# Patient Record
Sex: Male | Born: 1960 | Race: White | Hispanic: Yes | Marital: Single | Smoking: Never smoker
Health system: Southern US, Community
[De-identification: ages and names within clinical notes are randomized; demographics above are authoritative.]

## PROBLEM LIST (undated history)

## (undated) DIAGNOSIS — K3189 Other diseases of stomach and duodenum: Secondary | ICD-10-CM

## (undated) DIAGNOSIS — A048 Other specified bacterial intestinal infections: Secondary | ICD-10-CM

## (undated) DIAGNOSIS — K7682 Hepatic encephalopathy: Secondary | ICD-10-CM

## (undated) DIAGNOSIS — K429 Umbilical hernia without obstruction or gangrene: Secondary | ICD-10-CM

## (undated) DIAGNOSIS — K4090 Unilateral inguinal hernia, without obstruction or gangrene, not specified as recurrent: Secondary | ICD-10-CM

## (undated) DIAGNOSIS — D126 Benign neoplasm of colon, unspecified: Secondary | ICD-10-CM

## (undated) DIAGNOSIS — K7460 Unspecified cirrhosis of liver: Secondary | ICD-10-CM

## (undated) DIAGNOSIS — T7840XA Allergy, unspecified, initial encounter: Secondary | ICD-10-CM

## (undated) DIAGNOSIS — I10 Essential (primary) hypertension: Secondary | ICD-10-CM

## (undated) DIAGNOSIS — K7290 Hepatic failure, unspecified without coma: Secondary | ICD-10-CM

## (undated) DIAGNOSIS — K5790 Diverticulosis of intestine, part unspecified, without perforation or abscess without bleeding: Secondary | ICD-10-CM

## (undated) DIAGNOSIS — K766 Portal hypertension: Secondary | ICD-10-CM

## (undated) HISTORY — DX: Hepatic failure, unspecified without coma: K72.90

## (undated) HISTORY — DX: Diverticulosis of intestine, part unspecified, without perforation or abscess without bleeding: K57.90

## (undated) HISTORY — DX: Hepatic encephalopathy: K76.82

## (undated) HISTORY — DX: Unilateral inguinal hernia, without obstruction or gangrene, not specified as recurrent: K40.90

## (undated) HISTORY — DX: Portal hypertension: K76.6

## (undated) HISTORY — DX: Umbilical hernia without obstruction or gangrene: K42.9

## (undated) HISTORY — DX: Allergy, unspecified, initial encounter: T78.40XA

## (undated) HISTORY — DX: Other specified bacterial intestinal infections: A04.8

## (undated) HISTORY — PX: POLYPECTOMY: SHX149

## (undated) HISTORY — PX: COLONOSCOPY: SHX174

## (undated) HISTORY — DX: Benign neoplasm of colon, unspecified: D12.6

## (undated) HISTORY — DX: Other diseases of stomach and duodenum: K31.89

## (undated) HISTORY — DX: Unspecified cirrhosis of liver: K74.60

## (undated) HISTORY — DX: Essential (primary) hypertension: I10

---
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
## 1993-11-20 HISTORY — PX: OTHER SURGICAL HISTORY: SHX169

## 2001-05-09 ENCOUNTER — Encounter: Payer: Self-pay | Admitting: *Deleted

## 2001-05-09 ENCOUNTER — Ambulatory Visit (HOSPITAL_COMMUNITY): Admission: RE | Admit: 2001-05-09 | Discharge: 2001-05-09 | Payer: Self-pay | Admitting: *Deleted

## 2002-12-16 ENCOUNTER — Encounter: Payer: Self-pay | Admitting: Family Medicine

## 2002-12-16 ENCOUNTER — Encounter: Admission: RE | Admit: 2002-12-16 | Discharge: 2002-12-16 | Payer: Self-pay | Admitting: Family Medicine

## 2003-11-21 HISTORY — PX: HERNIA REPAIR: SHX51

## 2004-03-01 ENCOUNTER — Ambulatory Visit (HOSPITAL_BASED_OUTPATIENT_CLINIC_OR_DEPARTMENT_OTHER): Admission: RE | Admit: 2004-03-01 | Discharge: 2004-03-01 | Payer: Self-pay | Admitting: General Surgery

## 2007-01-07 ENCOUNTER — Ambulatory Visit: Payer: Self-pay | Admitting: Family Medicine

## 2007-01-28 ENCOUNTER — Ambulatory Visit: Payer: Self-pay | Admitting: Family Medicine

## 2007-04-01 ENCOUNTER — Ambulatory Visit: Payer: Self-pay | Admitting: Family Medicine

## 2007-04-03 ENCOUNTER — Ambulatory Visit (HOSPITAL_COMMUNITY): Admission: RE | Admit: 2007-04-03 | Discharge: 2007-04-03 | Payer: Self-pay | Admitting: Family Medicine

## 2008-05-20 ENCOUNTER — Ambulatory Visit: Payer: Self-pay | Admitting: Family Medicine

## 2009-06-08 ENCOUNTER — Ambulatory Visit: Payer: Self-pay | Admitting: Family Medicine

## 2009-08-05 ENCOUNTER — Ambulatory Visit: Admission: RE | Admit: 2009-08-05 | Discharge: 2009-08-05 | Payer: Self-pay | Admitting: Family Medicine

## 2009-08-05 ENCOUNTER — Ambulatory Visit: Payer: Self-pay | Admitting: Vascular Surgery

## 2009-08-05 ENCOUNTER — Encounter: Payer: Self-pay | Admitting: Family Medicine

## 2010-10-06 ENCOUNTER — Ambulatory Visit: Payer: Self-pay | Admitting: Family Medicine

## 2011-04-07 NOTE — Op Note (Signed)
NAME:  Christopher Craig, Christopher Craig NO.:  [id number]   MEDICAL RECORD NO.:  [id number]                   PATIENT TYPE:  AMB   LOCATION:  DSC                                  FACILITY:  [facility]   PHYSICIAN:  Adolph Pollack, M.D.            DATE OF BIRTH:  [birth year]   DATE OF PROCEDURE:  03/01/2004  DATE OF DISCHARGE:                                 OPERATIVE REPORT   PREOPERATIVE DIAGNOSIS:  Left inguinal hernia.   POSTOPERATIVE DIAGNOSIS:  Indirect left inguinal hernia.   OPERATION PERFORMED:  Left inguinal hernia repair with mesh.   SURGEON:  Adolph Pollack, M.D.   ANESTHESIA:  General laryngeal mask airway   INDICATIONS FOR PROCEDURE:  Christopher Craig is a 50 year old male very active who  has noted some pain and a bulge in the left groin area.  He has an obvious  left inguinal hernia on exam.  He presented to the outpatient surgery center  for left inguinal hernia repair.  Of note was he had some hypertension  preoperatively immediately in the outpatient center and has a history of it  in the past but is no longer on medication.  His diastolic blood pressure is  up to 112.  With a little sedation it came down so we decided to proceed.   DESCRIPTION OF PROCEDURE:  He was placed supine on the operating table,  initially given some intravenous sedation.  The left groin was clipped and  then sterilely prepped and draped.  Local anesthetic was infiltrated in the  left groin both superficially and deep and then an incision was made through  the skin and subcutaneous tissue and Scarpa's fascia.  However, he  was  fairly tense and in order for me to have adequate exposure, I needed more  relaxation.  Thus a general anesthetic was given by way of LMA.  This  allowed for adequate relaxation.  I infiltrated more local anesthetic  (consisting of 1% lidocaine with 0.5% Marcaine) deep to the external oblique  aponeurosis and then made an incision in the external  oblique aponeurosis  through the external ring medially and up toward the anterior superior iliac  spine laterally.  The ilioinguinal nerve was identified and the perineural  injection of local anesthetic given.  I then isolated the spermatic cord and  partially reduced an indirect hernia.  I was then able to dissect the cord  and the sac and dissect the sac free from the cord and reduce it back into  the extraperitoneal space through a patchless internal ring.   Next, the cord was retracted superiorly and I anchored a piece of 3 x 6 inch  mesh 1 cm medial to the pubic tubercle.  The inferior aspect of the mesh was  anchored to the shelving edge of the inguinal ligament with a running 2-0  Prolene suture to an area 1cm lateral to the internal ring.  A slit was cut  in the mesh and was wrapped around the cord. The superior aspect of the mesh  was anchored to the internal oblique muscle and aponeurosis with interrupted  2-0 Vicryl sutures.  Following this, the two tails of the mesh were crossed  creating a new internal ring and these two tails were anchored to the  shelving edge of the inguinal ligament with single 2-0 Prolene suture.  The  tip of the hemostat could be placed through the new aperture.   I inspected the wound and hemostasis was adequate.  The lateral aspects of  the mesh were tucked deep to the external oblique aponeurosis which was  closed over the mesh and cord with a running 3-0 Vicryl suture.  Scarpa's  fascia was closed with a running 2-0 Vicryl suture.  The skin was closed  with a 4-0 Monocryl subcuticular stitch.  Steri-Strips and sterile dressings  were applied.  The patient tolerated the procedure well without any apparent  complications.  The left testicle was in its normal position in the scrotum.  He subsequently was taken to the recovery room in satisfactory condition.  Discharge instructions and Tylox for pain were given to him.  I recommend he  see his primary  care physician later this week to have his blood pressure  rechecked and I discussed this with his family.                                               Adolph Pollack, M.D.    Kari Baars  D:  03/01/2004  T:  03/02/2004  Job:  [id number]   cc:   Sharlot Gowda, M.D.  [address]  [place], [place] [address]  Fax: [phone]

## 2011-08-24 ENCOUNTER — Encounter: Payer: Self-pay | Admitting: Family Medicine

## 2012-02-02 ENCOUNTER — Other Ambulatory Visit: Payer: Self-pay | Admitting: Family Medicine

## 2012-02-05 ENCOUNTER — Other Ambulatory Visit: Payer: Self-pay | Admitting: Internal Medicine

## 2012-02-06 ENCOUNTER — Ambulatory Visit (INDEPENDENT_AMBULATORY_CARE_PROVIDER_SITE_OTHER): Payer: 59 | Admitting: Family Medicine

## 2012-02-06 ENCOUNTER — Encounter: Payer: Self-pay | Admitting: Family Medicine

## 2012-02-06 ENCOUNTER — Telehealth: Payer: Self-pay | Admitting: Family Medicine

## 2012-02-06 VITALS — BP 150/90 | HR 84 | Temp 100.2°F | Ht 68.0 in | Wt 226.0 lb

## 2012-02-06 DIAGNOSIS — Z0000 Encounter for general adult medical examination without abnormal findings: Secondary | ICD-10-CM

## 2012-02-06 DIAGNOSIS — I10 Essential (primary) hypertension: Secondary | ICD-10-CM

## 2012-02-06 DIAGNOSIS — E669 Obesity, unspecified: Secondary | ICD-10-CM

## 2012-02-06 DIAGNOSIS — J069 Acute upper respiratory infection, unspecified: Secondary | ICD-10-CM

## 2012-02-06 LAB — POCT URINALYSIS DIPSTICK
Bilirubin, UA: NEGATIVE
Ketones, UA: NEGATIVE
Leukocytes, UA: NEGATIVE
Protein, UA: NEGATIVE
Spec Grav, UA: 1.01

## 2012-02-06 MED ORDER — ATENOLOL-CHLORTHALIDONE 100-25 MG PO TABS: 1.0000 | ORAL_TABLET | Freq: Every day | ORAL | Status: DC

## 2012-02-06 MED ORDER — LISINOPRIL 10 MG PO TABS: 10.0000 mg | ORAL_TABLET | Freq: Every day | ORAL | Status: DC

## 2012-02-06 NOTE — Progress Notes (Signed)
  Subjective:    Patient ID: Christopher Craig, male    DOB: 01/13/1961, 51 y.o.   MRN: [id number]  HPI He is here for complete examination. He does have a 2 day history of nasal congestion, rhinorrhea, cough but no fever, chills, sore throat or earache. He continues on his blood pressure medication. He works 12 hours per day which interferes with his physical activities. His weight has been slowly going up. He is presently not dating anyone and not involved in an exercise program. He will schedule his own colonoscopy.   Review of Systems  Constitutional: Negative.   HENT: Negative.   Eyes: Negative.   Respiratory: Negative.   Cardiovascular: Negative.   Gastrointestinal: Negative.   Genitourinary: Negative.   Musculoskeletal: Negative.   Skin: Negative.   Neurological: Negative.   Hematological: Negative.   Psychiatric/Behavioral: Negative.        Objective:   Physical Exam BP 150/90  Pulse 84  Temp(Src) 100.2 F (37.9 C) (Oral)  Ht 5\' 8"  (1.727 m)  Wt 226 lb (102.513 kg)  BMI 34.36 kg/m2  General Appearance:    Alert, cooperative, no distress, appears stated age  Head:    Normocephalic, without obvious abnormality, atraumatic  Eyes:    PERRL, conjunctiva/corneas clear, EOM's intact, fundi    benign  Ears:    Normal TM's and external ear canals  Nose:   Nares normal, mucosa normal, no drainage or sinus   tenderness  Throat:   Lips, mucosa, and tongue normal; teeth and gums normal  Neck:   Supple, no lymphadenopathy;  thyroid:  no   enlargement/tenderness/nodules; no carotid   bruit or JVD  Back:    Spine nontender, no curvature, ROM normal, no CVA     tenderness  Lungs:     Clear to auscultation bilaterally without wheezes, rales or     ronchi; respirations unlabored  Chest Wall:    No tenderness or deformity   Heart:    Regular rate and rhythm, S1 and S2 normal, no murmur, rub   or gallop  Breast Exam:    No chest wall tenderness, masses or gynecomastia  Abdomen:      Soft, non-tender, nondistended, normoactive bowel sounds,    no masses, no hepatosplenomegaly  Genitalia:   deferred   Rectal:   deferred   Extremities:   No clubbing, cyanosis or edema  Pulses:   2+ and symmetric all extremities  Skin:   Skin color, texture, turgor normal, no rashes or lesions  Lymph nodes:   Cervical, supraclavicular, and axillary nodes normal  Neurologic:   CNII-XII intact, normal strength, sensation and gait; reflexes 2+ and symmetric throughout          Psych:   Normal mood, affect, hygiene and grooming.           Assessment & Plan:   1. Routine general medical examination at a health care facility  CBC with Differential, Comprehensive metabolic panel, Lipid panel  2. Hypertension  lisinopril (PRINIVIL,ZESTRIL) 10 MG tablet  3. Obesity (BMI 30-39.9)  CBC with Differential, Comprehensive metabolic panel, Lipid panel  4. URI, acute     symptomatic care for the URI. I will add lisinopril to his regimen and continue his other medicines. He is to call me in one month and let me know his blood pressure is doing.

## 2012-02-06 NOTE — Patient Instructions (Signed)
Call me in one month with your blood pressure reading.

## 2012-02-06 NOTE — Telephone Encounter (Signed)
Tenoretic called in.

## 2012-02-07 ENCOUNTER — Telehealth: Payer: Self-pay

## 2012-02-07 LAB — LIPID PANEL
Cholesterol: 125 mg/dL (ref 0–200)
HDL: 32 mg/dL — ABNORMAL LOW (ref >39)
Total CHOL/HDL Ratio: 3.9 Ratio

## 2012-02-07 LAB — COMPREHENSIVE METABOLIC PANEL
ALT: 136 U/L — ABNORMAL HIGH (ref 0–53)
Albumin: 4.7 g/dL (ref 3.5–5.2)
BUN: 14 mg/dL (ref 6–23)
CO2: 26 mEq/L (ref 19–32)
Calcium: 9.5 mg/dL (ref 8.4–10.5)
Chloride: 95 mEq/L — ABNORMAL LOW (ref 96–112)
Creat: 0.91 mg/dL (ref 0.50–1.35)
Potassium: 3.4 mEq/L — ABNORMAL LOW (ref 3.5–5.3)

## 2012-02-07 LAB — CBC WITH DIFFERENTIAL/PLATELET
Eosinophils Relative: 2 % (ref 0–5)
HCT: 47.5 % (ref 39.0–52.0)
Hemoglobin: 17.1 g/dL — ABNORMAL HIGH (ref 13.0–17.0)
Lymphocytes Relative: 29 % (ref 12–46)
Lymphs Abs: 2.6 10*3/uL (ref 0.7–4.0)
MCV: 95.2 fL (ref 78.0–100.0)
Monocytes Absolute: 1 10*3/uL (ref 0.1–1.0)
Neutro Abs: 5.3 10*3/uL (ref 1.7–7.7)
RBC: 4.99 MIL/uL (ref 4.22–5.81)
WBC: 9.2 10*3/uL (ref 4.0–10.5)

## 2012-02-07 NOTE — Telephone Encounter (Signed)
Pt has been trying home remedies for his URI but he would like a antibiotic please

## 2012-02-07 NOTE — Telephone Encounter (Signed)
Let him know that I did not think an antibiotic would do him any good. Continue to treat with conservative care if his symptoms last over a week, have him call me

## 2012-02-07 NOTE — Telephone Encounter (Signed)
Pt informed and agreed

## 2012-03-07 ENCOUNTER — Telehealth: Payer: Self-pay | Admitting: Family Medicine

## 2012-03-12 NOTE — Telephone Encounter (Signed)
LM

## 2012-10-04 ENCOUNTER — Encounter: Payer: Self-pay | Admitting: Medical

## 2012-10-04 ENCOUNTER — Ambulatory Visit (INDEPENDENT_AMBULATORY_CARE_PROVIDER_SITE_OTHER): Payer: 59 | Admitting: Medical

## 2012-10-04 VITALS — BP 122/82 | HR 62 | Temp 98.2°F | Resp 16 | Wt 199.0 lb

## 2012-10-04 DIAGNOSIS — H60549 Acute eczematoid otitis externa, unspecified ear: Secondary | ICD-10-CM

## 2012-10-04 DIAGNOSIS — H60509 Unspecified acute noninfective otitis externa, unspecified ear: Secondary | ICD-10-CM

## 2012-10-04 NOTE — Patient Instructions (Signed)
Your symptoms and exam suggest eczema or atopic dermatitis of ear and canal.   I recommend for the next week use OTC Hydrocortisone cream such as Cortaid 1%.  apply daily to twice daily with qtip or finger for the next several days.  Once this calms down, use a daily moisturizing lotion or Vaseline topically daily.    Keep the area clean with mild soap and water.  You can also try a zinc base soap or shampoo OTC or selsun blue shampoo.    Recheck in 2-3 weeks.

## 2012-10-04 NOTE — Progress Notes (Signed)
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  Normally sees Dr. Susann Givens here. He is here for ear c/o.  Has had this intermittent for the last year at least.  Feels discomfort and pressure in right ear in canal.  Not painful but is aggravating, itchy, raw at times, red, flaky.  Has to clean it every morning from both ears which is not normal. People at work notice and he is self conscious of this.  He sometimes uses rubbing alcohol to clean it.  No other aggravating or relieving factors.  No hearing loss, no pain, no pus drainage.    Objective: Gen: wd, wn, nad bilat external ear along the inner most pinna and somewhat into the canals bilat with erythema, flaking, raw irritated skin.  No canal swelling, no purulent discharge, TMs pearly.  Assessment: Encounter Diagnosis  Name Primary?  . Eczema of external ear Yes   Plan: Patient Instructions  Your symptoms and exam suggest eczema or atopic dermatitis of ear and canal.   I recommend for the next week use OTC Hydrocortisone cream such as Cortaid 1%.  apply daily to twice daily with qtip or finger for the next several days.  Once this calms down, use a daily moisturizing lotion or Vaseline topically daily.    Keep the area clean with mild soap and water.  You can also try a zinc base soap or shampoo OTC or selsun blue shampoo.    Recheck in 2-3 weeks.

## 2012-10-31 ENCOUNTER — Ambulatory Visit (INDEPENDENT_AMBULATORY_CARE_PROVIDER_SITE_OTHER): Payer: 59 | Admitting: Family Medicine

## 2012-10-31 ENCOUNTER — Encounter: Payer: Self-pay | Admitting: Family Medicine

## 2012-10-31 VITALS — BP 126/88 | HR 80 | Wt 194.0 lb

## 2012-10-31 DIAGNOSIS — L259 Unspecified contact dermatitis, unspecified cause: Secondary | ICD-10-CM

## 2012-10-31 DIAGNOSIS — L309 Dermatitis, unspecified: Secondary | ICD-10-CM

## 2012-10-31 NOTE — Progress Notes (Signed)
  Subjective:    Patient ID: Christopher Craig, male    DOB: 10/18/1961, 51 y.o.   MRN: [id number]  HPI He is here for recheck. Still does have difficulty with scaling and itching in both ears. He has been using cortisone sparingly for a short period of time. Also of note is the fact that he has lost a considerable amount of weight making dietary and exercise changes.   Review of Systems     Objective:   Physical Exam Alert and in no distress. Exam of both ear canals show erythema slight drying and scaling.       Assessment & Plan:   1. Eczema    recommend he use the cortisone cream twice per day for the next 2 or 3 weeks minimum and after that he can spread the therapy out but still these to continue

## 2012-10-31 NOTE — Patient Instructions (Signed)
Use cortisone cream twice a day for the next several weeks. Once it is under control thenyou can back off

## 2013-01-04 ENCOUNTER — Other Ambulatory Visit: Payer: Self-pay

## 2013-02-02 ENCOUNTER — Encounter: Payer: Self-pay | Admitting: Family Medicine

## 2013-02-03 ENCOUNTER — Other Ambulatory Visit: Payer: Self-pay

## 2013-02-03 DIAGNOSIS — I10 Essential (primary) hypertension: Secondary | ICD-10-CM

## 2013-02-03 MED ORDER — LISINOPRIL 10 MG PO TABS: 10.0000 mg | ORAL_TABLET | Freq: Every day | ORAL | Status: DC

## 2013-02-03 MED ORDER — ATENOLOL-CHLORTHALIDONE 100-25 MG PO TABS: 1.0000 | ORAL_TABLET | Freq: Every day | ORAL | Status: DC

## 2013-02-03 NOTE — Telephone Encounter (Signed)
Sent in pt b/p meds

## 2013-03-07 ENCOUNTER — Encounter: Payer: Self-pay | Admitting: Family Medicine

## 2013-03-13 ENCOUNTER — Encounter: Payer: Self-pay | Admitting: Family Medicine

## 2013-03-21 ENCOUNTER — Encounter: Payer: Self-pay | Admitting: Family Medicine

## 2013-03-21 ENCOUNTER — Ambulatory Visit (INDEPENDENT_AMBULATORY_CARE_PROVIDER_SITE_OTHER): Payer: 59 | Admitting: Family Medicine

## 2013-03-21 ENCOUNTER — Other Ambulatory Visit: Payer: Self-pay | Admitting: Family Medicine

## 2013-03-21 VITALS — BP 114/78 | HR 86 | Ht 66.0 in | Wt 197.0 lb

## 2013-03-21 DIAGNOSIS — Z0000 Encounter for general adult medical examination without abnormal findings: Secondary | ICD-10-CM

## 2013-03-21 DIAGNOSIS — E669 Obesity, unspecified: Secondary | ICD-10-CM

## 2013-03-21 DIAGNOSIS — L259 Unspecified contact dermatitis, unspecified cause: Secondary | ICD-10-CM

## 2013-03-21 DIAGNOSIS — L309 Dermatitis, unspecified: Secondary | ICD-10-CM | POA: Insufficient documentation

## 2013-03-21 DIAGNOSIS — I10 Essential (primary) hypertension: Secondary | ICD-10-CM

## 2013-03-21 DIAGNOSIS — Z125 Encounter for screening for malignant neoplasm of prostate: Secondary | ICD-10-CM

## 2013-03-21 LAB — CBC WITH DIFFERENTIAL/PLATELET
Basophils Relative: 0 % (ref 0–1)
HCT: 46.6 % (ref 39.0–52.0)
Hemoglobin: 16.4 g/dL (ref 13.0–17.0)
Lymphocytes Relative: 27 % (ref 12–46)
Lymphs Abs: 2.6 10*3/uL (ref 0.7–4.0)
Monocytes Absolute: 0.9 10*3/uL (ref 0.1–1.0)
Monocytes Relative: 9 % (ref 3–12)
Neutro Abs: 6.3 10*3/uL (ref 1.7–7.7)
Neutrophils Relative %: 63 % (ref 43–77)
RBC: 4.98 MIL/uL (ref 4.22–5.81)
WBC: 9.9 10*3/uL (ref 4.0–10.5)

## 2013-03-21 LAB — COMPREHENSIVE METABOLIC PANEL
Albumin: 4.8 g/dL (ref 3.5–5.2)
BUN: 16 mg/dL (ref 6–23)
CO2: 30 mEq/L (ref 19–32)
Glucose, Bld: 113 mg/dL — ABNORMAL HIGH (ref 70–99)
Potassium: 3.8 mEq/L (ref 3.5–5.3)
Sodium: 138 mEq/L (ref 135–145)
Total Bilirubin: 0.8 mg/dL (ref 0.3–1.2)
Total Protein: 7.4 g/dL (ref 6.0–8.3)

## 2013-03-21 LAB — POCT URINALYSIS DIPSTICK
Bilirubin, UA: NEGATIVE
Glucose, UA: NEGATIVE
Ketones, UA: NEGATIVE
Leukocytes, UA: NEGATIVE
Nitrite, UA: NEGATIVE
pH, UA: 7

## 2013-03-21 LAB — LIPID PANEL
Cholesterol: 152 mg/dL (ref 0–200)
HDL: 47 mg/dL (ref >39)
Triglycerides: 187 mg/dL — ABNORMAL HIGH (ref <150)

## 2013-03-21 MED ORDER — BETAMETHASONE VALERATE 0.1 % EX OINT: TOPICAL_OINTMENT | Freq: Two times a day (BID) | CUTANEOUS | Status: DC

## 2013-03-21 NOTE — Progress Notes (Signed)
  Subjective:    Patient ID: Christopher Craig, male    DOB: [birth year], 52 y.o.   MRN: [id number]  HPI He is here for complete examination. He continues on his blood pressure medication without difficulty. He has lost over 20 pounds in the last year mainly through exercise. He does not smoke but does continue to drink roughly 10 beers per week. He is still having some itching in his ears and finds beclomethasone works quite nicely for that. He was seen by his orthopedic surgeon and given an injection into his right shoulder which apparently has been helping. He is also using acupuncture which helps. He is now living alone. The relationship he was then apparently ended. His work is going well.   Review of Systems  Constitutional: Negative.   HENT: Negative.   Eyes: Negative.   Respiratory: Negative.   Cardiovascular: Negative.   Gastrointestinal: Negative.   Endocrine: Negative.   Genitourinary: Negative.   Allergic/Immunologic: Negative.   Neurological: Negative.   Hematological: Negative.   Psychiatric/Behavioral: Negative.        Objective:   Physical Exam BP 114/78  Pulse 86  Ht 5\' 6"  (1.676 m)  Wt 197 lb (89.359 kg)  BMI 31.81 kg/m2  General Appearance:    Alert, cooperative, no distress, appears stated age  Head:    Normocephalic, without obvious abnormality, atraumatic  Eyes:    PERRL, conjunctiva/corneas clear, EOM's intact, fundi    benign  Ears:    Normal TM's and external ear canals  Nose:   Nares normal, mucosa normal, no drainage or sinus   tenderness  Throat:   Lips, mucosa, and tongue normal; teeth and gums normal  Neck:   Supple, no lymphadenopathy;  thyroid:  no   enlargement/tenderness/nodules; no carotid   bruit or JVD  Back:    Spine nontender, no curvature, ROM normal, no CVA     tenderness  Lungs:     Clear to auscultation bilaterally without wheezes, rales or     ronchi; respirations unlabored  Chest Wall:    No tenderness or deformity   Heart:    Regular  rate and rhythm, S1 and S2 normal, no murmur, rub   or gallop  Breast Exam:    No chest wall tenderness, masses or gynecomastia  Abdomen:     Soft, non-tender, nondistended, normoactive bowel sounds,    no masses, no hepatosplenomegaly  Genitalia:  deferred  Rectal:  deferred  Extremities:   No clubbing, cyanosis or edema  Pulses:   2+ and symmetric all extremities  Skin:   Skin color, texture, turgor normal, no rashes or lesions  Lymph nodes:   Cervical, supraclavicular, and axillary nodes normal  Neurologic:   CNII-XII intact, normal strength, sensation and gait; reflexes 2+ and symmetric throughout          Psych:   Normal mood, affect, hygiene and grooming.          Assessment & Plan:  Routine general medical examination at a health care facility - Plan: [facility] COLONOSCOPY, CBC with Differential, Comprehensive metabolic panel, Lipid panel, PSA  Hypertension - Plan: POCT urinalysis dipstick, CBC with Differential, Comprehensive metabolic panel  Obesity (BMI 30-39.9) - Plan: Lipid panel  Eczema - Plan: betamethasone valerate ointment (VALISONE) 0.1 %  Special screening for malignant neoplasm of prostate - Plan: PSA encouraged him to continue to take good care of himself. PSA was discussed and I will order this. He'll also be set up for colonoscopy.

## 2013-03-21 NOTE — Progress Notes (Signed)
APPT. MADE WITH DR.MANN MAY 13 AT 1:30

## 2013-03-25 ENCOUNTER — Encounter: Payer: Self-pay | Admitting: Family Medicine

## 2013-03-25 LAB — HEMOGLOBIN A1C: Mean Plasma Glucose: 103 mg/dL (ref <117)

## 2013-04-03 ENCOUNTER — Encounter: Payer: Self-pay | Admitting: Internal Medicine

## 2013-04-24 ENCOUNTER — Encounter: Payer: Self-pay | Admitting: Family Medicine

## 2013-05-27 ENCOUNTER — Encounter: Payer: Self-pay | Admitting: Family Medicine

## 2013-09-25 ENCOUNTER — Other Ambulatory Visit: Payer: Self-pay

## 2013-10-12 ENCOUNTER — Encounter: Payer: Self-pay | Admitting: Family Medicine

## 2013-10-21 ENCOUNTER — Encounter: Payer: Self-pay | Admitting: Internal Medicine

## 2013-10-23 ENCOUNTER — Ambulatory Visit (AMBULATORY_SURGERY_CENTER): Payer: Self-pay

## 2013-10-23 VITALS — Ht 66.0 in | Wt 212.0 lb

## 2013-10-23 DIAGNOSIS — Z1211 Encounter for screening for malignant neoplasm of colon: Secondary | ICD-10-CM

## 2013-10-23 MED ORDER — PEG-KCL-NACL-NASULF-NA ASC-C 100 G PO SOLR: 1.0000 | Freq: Once | ORAL | Status: DC

## 2013-10-27 ENCOUNTER — Encounter: Payer: Self-pay | Admitting: Internal Medicine

## 2013-10-27 ENCOUNTER — Ambulatory Visit (AMBULATORY_SURGERY_CENTER): Payer: 59 | Admitting: Internal Medicine

## 2013-10-27 VITALS — BP 119/69 | HR 63 | Temp 98.9°F | Resp 16 | Ht 66.0 in | Wt 212.0 lb

## 2013-10-27 DIAGNOSIS — Z1211 Encounter for screening for malignant neoplasm of colon: Secondary | ICD-10-CM

## 2013-10-27 DIAGNOSIS — D126 Benign neoplasm of colon, unspecified: Secondary | ICD-10-CM

## 2013-10-27 MED ORDER — SODIUM CHLORIDE 0.9 % IV SOLN: 500.0000 mL | INTRAVENOUS | Status: DC

## 2013-10-27 NOTE — Op Note (Signed)
[facility] [address] [place], [address]   COLONOSCOPY PROCEDURE REPORT  PATIENT: Symir, Mah  MR#: [id number] BIRTHDATE: 1961/02/25 , 52  yrs. old GENDER: Male ENDOSCOPIST: Beverley Fiedler, MD REFERRED XB:JYNW Susann Givens, M.D. PROCEDURE DATE:  10/27/2013 PROCEDURE:   Colonoscopy with cold biopsy polypectomy and Colonoscopy with snare polypectomy First Screening Colonoscopy - Avg.  risk and is 50 yrs.  old or older Yes.  Prior Negative Screening - Now for repeat screening. N/A  History of Adenoma - Now for follow-up colonoscopy & has been > or = to 3 yrs.  N/A  Polyps Removed Today? Yes. ASA CLASS:   Class II INDICATIONS:average risk screening and first colonoscopy. MEDICATIONS: MAC sedation, administered by CRNA and propofol (Diprivan) 400mg  IV  DESCRIPTION OF PROCEDURE:   After the risks benefits and alternatives of the procedure were thoroughly explained, informed consent was obtained.  A digital rectal exam revealed no rectal mass.   The LB [id number]  endoscope was introduced through the anus and advanced to the cecum, which was identified by both the appendix and ileocecal valve. No adverse events experienced. The quality of the prep was good, using MoviPrep  The instrument was then slowly withdrawn as the colon was fully examined.    COLON FINDINGS: Four sessile polyps measuring 4-6 mm in size were found at the hepatic flexure, in the transverse colon, descending colon, and sigmoid colon.  Polypectomy was performed using cold snare.  All resections were complete and all polyp tissue was completely retrieved.   Seven sessile polyps ranging between 3-53mm in size were found in the ascending colon, transverse colon, sigmoid colon, and rectum.  Polypectomy was performed with cold forceps.  All resections were complete and all polyp tissue was completely retrieved.   Two semi-pedunculated polyps measuring 8-10 mm in size were found in the sigmoid  colon.  Polypectomy was performed using hot snare.  All resections were complete and all polyp tissue was completely retrieved.   There was mild scattered diverticulosis noted in the transverse colon, descending colon, and sigmoid colon.  Retroflexed views revealed no abnormalities. The time to cecum=3 minutes 26 seconds.  Withdrawal time=27 minutes 36 seconds.  The scope was withdrawn and the procedure completed. COMPLICATIONS: There were no complications.  ENDOSCOPIC IMPRESSION: 1.   Four sessile polyps measuring 4-6 mm in size were found at the hepatic flexure, in the transverse colon, descending colon, and sigmoid colon; Polypectomy was performed using cold snare 2.   Seven sessile polyps ranging between 3-82mm in size were found in the ascending colon, transverse colon, sigmoid colon, and rectum; Polypectomy was performed with cold forceps 3.   Two semi-pedunculated polyps measuring 8-10 mm in size were found in the sigmoid colon; Polypectomy was performed using hot snare 4.   There was mild diverticulosis noted in the transverse colon, descending colon, and sigmoid colon  RECOMMENDATIONS: 1.  Hold aspirin, aspirin products, and anti-inflammatory medication for 2 weeks. 2.  Await pathology results 3.  High fiber diet 4.  Timing of repeat colonoscopy will be determined by pathology findings. 5.  You will receive a letter within 1-2 weeks with the results of your biopsy as well as final recommendations.  Please call my office if you have not received a letter after 3 weeks.   eSigned:  Beverley Fiedler, MD 10/27/2013 8:57 AM   cc: The Patient and Sharlot Gowda, MD   PATIENT NAME:  Ruven, Corradi MR#: [id number]

## 2013-10-27 NOTE — Progress Notes (Signed)
Pt was dressing and he said his nose did start to ooze blood.  He put tissue in his nose and held pressure.  He said he was going to get the nasal saline spray.  If this does not stop the bleeding he needs to see a E.N.T.  He states he understands. Maw

## 2013-10-27 NOTE — Patient Instructions (Addendum)
YOU HAD AN ENDOSCOPIC PROCEDURE TODAY AT THE [facility]: Refer to the procedure report that was given to you for any specific questions about what was found during the examination.  If the procedure report does not answer your questions, please call your gastroenterologist to clarify.  If you requested that your care partner not be given the details of your procedure findings, then the procedure report has been included in a sealed envelope for you to review at your convenience later.  YOU SHOULD EXPECT: Some feelings of bloating in the abdomen. Passage of more gas than usual.  Walking can help get rid of the air that was put into your GI tract during the procedure and reduce the bloating. If you had a lower endoscopy (such as a colonoscopy or flexible sigmoidoscopy) you may notice spotting of blood in your stool or on the toilet paper. If you underwent a bowel prep for your procedure, then you may not have a normal bowel movement for a few days.  DIET: Your first meal following the procedure should be a light meal and then it is ok to progress to your normal diet.  A half-sandwich or bowl of soup is an example of a good first meal.  Heavy or fried foods are harder to digest and may make you feel nauseous or bloated.  Likewise meals heavy in dairy and vegetables can cause extra gas to form and this can also increase the bloating.  Drink plenty of fluids but you should avoid alcoholic beverages for 24 hours.  ACTIVITY: Your care partner should take you home directly after the procedure.  You should plan to take it easy, moving slowly for the rest of the day.  You can resume normal activity the day after the procedure however you should NOT DRIVE or use heavy machinery for 24 hours (because of the sedation medicines used during the test).    SYMPTOMS TO REPORT IMMEDIATELY: A gastroenterologist can be reached at any hour.  During normal business hours, 8:30 AM to 5:00 PM Monday through Friday,  call [phone].  After hours and on weekends, please call the GI answering service at [phone] who will take a message and have the physician on call contact you.   Following lower endoscopy (colonoscopy or flexible sigmoidoscopy):  Excessive amounts of blood in the stool  Significant tenderness or worsening of abdominal pains  Swelling of the abdomen that is new, acute  Fever of 100F or higher   FOLLOW UP: If any biopsies were taken you will be contacted by phone or by letter within the next 1-3 weeks.  Call your gastroenterologist if you have not heard about the biopsies in 3 weeks.  Our staff will call the home number listed on your records the next business day following your procedure to check on you and address any questions or concerns that you may have at that time regarding the information given to you following your procedure. This is a courtesy call and so if there is no answer at the home number and we have not heard from you through the emergency physician on call, we will assume that you have returned to your regular daily activities without incident.  SIGNATURES/CONFIDENTIALITY: You and/or your care partner have signed paperwork which will be entered into your electronic medical record.  These signatures attest to the fact that that the information above on your After Visit Summary has been reviewed and is understood.  Full responsibility of the confidentiality of   this discharge information lies with you and/or your care-partner.    Handouts were given on polyps, diverticulosis and a high fiber diet with liberal fluid intake. Please hold aspirin, aspirin products and any anti-inflammatory medication for 2 weeks. You may resume your other current medications today. You had a nose bleed while your colonoscopy was being completed.  You may try over the counter nasal saline spray to help moisturize your nasal passage way. Please call if any questions or concerns.

## 2013-10-27 NOTE — Progress Notes (Signed)
Called to room to assist during endoscopic procedure.  Patient ID and intended procedure confirmed with present staff. Received instructions for my participation in the procedure from the performing physician.  

## 2013-10-27 NOTE — Progress Notes (Signed)
Pt and his daughter was advised to get otc nasal saline spray to help moisturize his nasal passage.  No nasal bleeding noted in the recovery room.  No complaints in the recovery room either. Maw

## 2013-10-28 ENCOUNTER — Telehealth: Payer: Self-pay

## 2013-10-28 NOTE — Telephone Encounter (Signed)
Left a message on the pt's answering machine to call us back if any questions or concerns. Maw

## 2013-10-30 ENCOUNTER — Encounter: Payer: Self-pay | Admitting: Internal Medicine

## 2013-11-04 ENCOUNTER — Telehealth: Payer: Self-pay | Admitting: *Deleted

## 2013-11-04 NOTE — Telephone Encounter (Signed)
Letter from: Beverley Fiedler    Reason for Letter: Results Review    Comments: repeat colonoscopy in 1 year 11/04/13 SENT Lucienne Capers, medical genetics referral for multiple adenomatous colon polyps       Genetics referral made. Informed pt who stated understanding.

## 2013-11-05 ENCOUNTER — Encounter: Payer: Self-pay | Admitting: Family Medicine

## 2013-11-05 ENCOUNTER — Telehealth: Payer: Self-pay | Admitting: Genetic Counselor

## 2013-11-05 NOTE — Telephone Encounter (Signed)
[facility] FOR PT RETURN CALL IN RE TO REFERRAL .  °

## 2014-01-04 ENCOUNTER — Encounter: Payer: Self-pay | Admitting: Family Medicine

## 2014-01-12 ENCOUNTER — Encounter: Payer: Self-pay | Admitting: Family Medicine

## 2014-01-13 ENCOUNTER — Encounter: Payer: Self-pay | Admitting: Family Medicine

## 2014-02-16 ENCOUNTER — Encounter: Payer: Self-pay | Admitting: Family Medicine

## 2014-02-16 ENCOUNTER — Ambulatory Visit (INDEPENDENT_AMBULATORY_CARE_PROVIDER_SITE_OTHER): Payer: 59 | Admitting: Family Medicine

## 2014-02-16 VITALS — BP 130/84 | HR 88 | Wt 214.0 lb

## 2014-02-16 DIAGNOSIS — G5622 Lesion of ulnar nerve, left upper limb: Secondary | ICD-10-CM

## 2014-02-16 DIAGNOSIS — G5620 Lesion of ulnar nerve, unspecified upper limb: Secondary | ICD-10-CM

## 2014-02-16 DIAGNOSIS — G5600 Carpal tunnel syndrome, unspecified upper limb: Secondary | ICD-10-CM

## 2014-02-16 NOTE — Progress Notes (Signed)
   Subjective:    Patient ID: Christopher Craig, male    DOB: [birth year], 53 y.o.   MRN: [id number]  HPI Approximately 2 months ago he noted a tingling sensation in all his fingers and occasionally his thumb. He would note some numbness stiffness and tingling when he would wake up again stating it was in all fingertips . He thought this could be carpal tunnel and did use a wrist splint. He states that he did get some intermittent leak of his symptoms .He was seen in employee health on February 9. He was told that it was an ulnar nerve issue and given an elbow brace to keep his arm position more straight. It did help with the sensation in the fourth and fifth fingers but then he continued to note difficulty in the other 3 fingers . He states that he is somewhat better since she started 6 weeks ago.   Review of Systems     Objective:   Physical Exam Alert and in no distress. Tinel and Phalen's test was negative. Normal sensation and strength. No atrophy noted.       Assessment & Plan:  CTS (carpal tunnel syndrome) - Plan: Motor nerve conduction test w/ f-wave study  Ulnar neuropathy at elbow of left upper extremity - Plan: Motor nerve conduction test w/ f-wave study  I explained that looks as if he really has a combination of both CTS and ulnar neuropathy. Since he has made some progress but not as much as he would like, I will get nerve conduction studies to further assess this and make recommendations at that point. I did discuss proper wrist and elbow positioning.

## 2014-02-16 NOTE — Progress Notes (Signed)
Pt was scheduled for 02/19/14 @ 10am for [facility] but pt can't make it so pt was going to call and reschedule

## 2014-02-20 ENCOUNTER — Encounter: Payer: Self-pay | Admitting: Family Medicine

## 2014-02-23 ENCOUNTER — Telehealth: Payer: Self-pay | Admitting: Family Medicine

## 2014-02-23 NOTE — Telephone Encounter (Signed)
Let him know that I reviewed the EMG and have him set up an appointment that is convenient for him for an injection

## 2014-02-24 NOTE — Telephone Encounter (Signed)
Pt coming in tomorrow 02/25/14 for injection

## 2014-02-25 ENCOUNTER — Encounter: Payer: Self-pay | Admitting: Family Medicine

## 2014-02-25 ENCOUNTER — Ambulatory Visit (INDEPENDENT_AMBULATORY_CARE_PROVIDER_SITE_OTHER): Payer: 59 | Admitting: Family Medicine

## 2014-02-25 VITALS — Wt 215.0 lb

## 2014-02-25 DIAGNOSIS — G5600 Carpal tunnel syndrome, unspecified upper limb: Secondary | ICD-10-CM

## 2014-02-25 DIAGNOSIS — M25539 Pain in unspecified wrist: Secondary | ICD-10-CM

## 2014-02-25 DIAGNOSIS — M25532 Pain in left wrist: Secondary | ICD-10-CM

## 2014-02-25 MED ORDER — TRIAMCINOLONE ACETONIDE 40 MG/ML IJ SUSP
40.0000 mg | Freq: Once | INTRAMUSCULAR | Status: AC
  Administered 2014-02-25: 40 mg via INTRAMUSCULAR

## 2014-02-25 MED ORDER — LIDOCAINE HCL (PF) 1 % IJ SOLN
5.0000 mL | Freq: Once | INTRAMUSCULAR | Status: AC
  Administered 2014-02-25: 5 mL via INTRADERMAL

## 2014-02-25 NOTE — Progress Notes (Signed)
   Subjective:    Patient ID: Christopher Craig, male    DOB: July 12, 1961, 53 y.o.   MRN: [id number]  HPI He is here for consultation after recent EMG did show evidence of mild CTS with no major nerve impairment. He would like it injected.   Review of Systems     Objective:   Physical Exam Alert and in no distress otherwise not examined       Assessment & Plan:  Wrist pain, left - Plan: triamcinolone acetonide (KENALOG-40) injection 40 mg, lidocaine (PF) (XYLOCAINE) 1 % injection 5 mL  CTS (carpal tunnel syndrome)  the wrist was prepped with Betadine. The palmaris longus tendon was identified. Injection was made medial to the tendon. The needle was placed in approximately 3/4 of an inch. He experienced no pain. Injection was made without difficulty. He is told to keep in touch with me and if no major improvement, refer to orthopedics will be made.

## 2014-03-01 ENCOUNTER — Encounter: Payer: Self-pay | Admitting: Family Medicine

## 2014-03-02 ENCOUNTER — Other Ambulatory Visit: Payer: Self-pay | Admitting: Family Medicine

## 2014-03-02 DIAGNOSIS — L309 Dermatitis, unspecified: Secondary | ICD-10-CM

## 2014-03-02 MED ORDER — BETAMETHASONE VALERATE 0.1 % EX OINT: TOPICAL_OINTMENT | Freq: Two times a day (BID) | CUTANEOUS | Status: DC

## 2014-03-11 ENCOUNTER — Encounter: Payer: Self-pay | Admitting: Family Medicine

## 2014-04-01 ENCOUNTER — Encounter: Payer: Self-pay | Admitting: Family Medicine

## 2014-04-15 ENCOUNTER — Encounter: Payer: Self-pay | Admitting: Family Medicine

## 2014-04-15 ENCOUNTER — Other Ambulatory Visit: Payer: Self-pay

## 2014-04-15 DIAGNOSIS — I10 Essential (primary) hypertension: Secondary | ICD-10-CM

## 2014-04-15 MED ORDER — LISINOPRIL 10 MG PO TABS: 10.0000 mg | ORAL_TABLET | Freq: Every day | ORAL | Status: DC

## 2014-04-15 MED ORDER — ATENOLOL-CHLORTHALIDONE 100-25 MG PO TABS: 1.0000 | ORAL_TABLET | Freq: Every day | ORAL | Status: DC

## 2014-06-04 ENCOUNTER — Encounter: Payer: Self-pay | Admitting: Family Medicine

## 2014-06-04 ENCOUNTER — Other Ambulatory Visit: Payer: Self-pay | Admitting: Family Medicine

## 2014-06-04 MED ORDER — CLOBETASOL PROPIONATE 0.05 % EX CREA: 1.0000 "application " | TOPICAL_CREAM | Freq: Two times a day (BID) | CUTANEOUS | Status: DC

## 2014-08-19 ENCOUNTER — Encounter: Payer: Self-pay | Admitting: Family Medicine

## 2014-09-15 ENCOUNTER — Encounter: Payer: Self-pay | Admitting: Internal Medicine

## 2014-09-21 ENCOUNTER — Other Ambulatory Visit: Payer: Self-pay | Admitting: Family Medicine

## 2014-09-21 MED ORDER — ATENOLOL-CHLORTHALIDONE 100-25 MG PO TABS: 1.0000 | ORAL_TABLET | Freq: Every day | ORAL | Status: DC

## 2014-09-21 MED ORDER — LISINOPRIL 10 MG PO TABS: 10.0000 mg | ORAL_TABLET | Freq: Every day | ORAL | Status: DC

## 2014-09-24 ENCOUNTER — Encounter: Payer: Self-pay | Admitting: Internal Medicine

## 2014-10-05 ENCOUNTER — Telehealth: Payer: Self-pay | Admitting: Family Medicine

## 2014-10-05 DIAGNOSIS — Z0000 Encounter for general adult medical examination without abnormal findings: Secondary | ICD-10-CM

## 2014-10-05 NOTE — Telephone Encounter (Signed)
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

## 2014-10-05 NOTE — Telephone Encounter (Signed)
Let him know that I put a future order in for the blood work

## 2014-10-06 NOTE — Telephone Encounter (Signed)
Pt has been informed.

## 2014-10-09 ENCOUNTER — Other Ambulatory Visit: Payer: Self-pay | Admitting: Family Medicine

## 2014-10-09 LAB — CBC WITH DIFFERENTIAL/PLATELET
Basophils Absolute: 0 10*3/uL (ref 0.0–0.1)
Basophils Relative: 0 % (ref 0–1)
EOS PCT: 1 % (ref 0–5)
Eosinophils Absolute: 0.1 10*3/uL (ref 0.0–0.7)
HCT: 45.8 % (ref 39.0–52.0)
HEMOGLOBIN: 16.4 g/dL (ref 13.0–17.0)
LYMPHS ABS: 2.5 10*3/uL (ref 0.7–4.0)
Lymphocytes Relative: 31 % (ref 12–46)
MCH: 34.2 pg — AB (ref 26.0–34.0)
MCHC: 35.8 g/dL (ref 30.0–36.0)
MCV: 95.4 fL (ref 78.0–100.0)
MPV: 11.2 fL (ref 9.4–12.4)
Monocytes Absolute: 0.6 10*3/uL (ref 0.1–1.0)
Monocytes Relative: 8 % (ref 3–12)
NEUTROS PCT: 60 % (ref 43–77)
Neutro Abs: 4.8 10*3/uL (ref 1.7–7.7)
Platelets: 211 10*3/uL (ref 150–400)
RBC: 4.8 MIL/uL (ref 4.22–5.81)
RDW: 12.3 % (ref 11.5–15.5)
WBC: 8 10*3/uL (ref 4.0–10.5)

## 2014-10-09 LAB — COMPREHENSIVE METABOLIC PANEL
ALBUMIN: 4.4 g/dL (ref 3.5–5.2)
ALT: 85 U/L — ABNORMAL HIGH (ref 0–53)
AST: 125 U/L — ABNORMAL HIGH (ref 0–37)
Alkaline Phosphatase: 89 U/L (ref 39–117)
BILIRUBIN TOTAL: 1.1 mg/dL (ref 0.2–1.2)
BUN: 21 mg/dL (ref 6–23)
CO2: 26 mEq/L (ref 19–32)
Calcium: 9.9 mg/dL (ref 8.4–10.5)
Chloride: 97 mEq/L (ref 96–112)
Creat: 0.94 mg/dL (ref 0.50–1.35)
Glucose, Bld: 114 mg/dL — ABNORMAL HIGH (ref 70–99)
Potassium: 3.5 mEq/L (ref 3.5–5.3)
SODIUM: 137 meq/L (ref 135–145)
TOTAL PROTEIN: 7.2 g/dL (ref 6.0–8.3)

## 2014-10-09 LAB — LIPID PANEL
CHOLESTEROL: 133 mg/dL (ref 0–200)
HDL: 40 mg/dL (ref >39)
LDL Cholesterol: 44 mg/dL (ref 0–99)
Total CHOL/HDL Ratio: 3.3 Ratio
Triglycerides: 246 mg/dL — ABNORMAL HIGH (ref <150)
VLDL: 49 mg/dL — AB (ref 0–40)

## 2014-10-27 ENCOUNTER — Ambulatory Visit (INDEPENDENT_AMBULATORY_CARE_PROVIDER_SITE_OTHER): Payer: 59 | Admitting: Family Medicine

## 2014-10-27 ENCOUNTER — Encounter: Payer: Self-pay | Admitting: Family Medicine

## 2014-10-27 VITALS — BP 120/80 | HR 70 | Ht 66.5 in | Wt 217.0 lb

## 2014-10-27 DIAGNOSIS — Z86010 Personal history of colonic polyps: Secondary | ICD-10-CM | POA: Insufficient documentation

## 2014-10-27 DIAGNOSIS — I10 Essential (primary) hypertension: Secondary | ICD-10-CM

## 2014-10-27 DIAGNOSIS — E669 Obesity, unspecified: Secondary | ICD-10-CM

## 2014-10-27 DIAGNOSIS — Z0000 Encounter for general adult medical examination without abnormal findings: Secondary | ICD-10-CM

## 2014-10-27 DIAGNOSIS — R748 Abnormal levels of other serum enzymes: Secondary | ICD-10-CM

## 2014-10-27 DIAGNOSIS — M1990 Unspecified osteoarthritis, unspecified site: Secondary | ICD-10-CM | POA: Insufficient documentation

## 2014-10-27 NOTE — Patient Instructions (Signed)
Cut your beer drinking in  Half.

## 2014-10-27 NOTE — Progress Notes (Signed)
Subjective:    Patient ID: Christopher Craig, male    DOB: Jun 25, 1961, 53 y.o.   MRN: [id number]  HPI  he is here for complete examination. He does admit to weight gain and blames this on lifestyle including alcohol consumption. He readily admits to drinking a 6 pack per day at least 3 days per week. He does have difficulty with arthritic symptoms mainly in his left hand , fifth and first finger. These fingers are more stiff in the morning and the symptoms do go away as the day goes on. He has a history of colonic polyps and is set up to have another colonoscopy within the next several months. His work is going well. He is not dating anyone now his family his social history was otherwise reviewed.   Review of Systems  All other systems reviewed and are negative.      Objective:   Physical Exam BP 120/80 mmHg  Pulse 70  Ht 5' 6.5" (1.689 m)  Wt 217 lb (98.431 kg)  BMI 34.50 kg/m2  SpO2 98%  General Appearance:    Alert, cooperative, no distress, appears stated age  Head:    Normocephalic, without obvious abnormality, atraumatic  Eyes:    PERRL, conjunctiva/corneas clear, EOM's intact, fundi    benign  Ears:    Normal TM's and external ear canals  Nose:   Nares normal, mucosa normal, no drainage or sinus   tenderness  Throat:   Lips, mucosa, and tongue normal; teeth and gums normal  Neck:   Supple, no lymphadenopathy;  thyroid:  no   enlargement/tenderness/nodules; no carotid   bruit or JVD  Back:    Spine nontender, no curvature, ROM normal, no CVA     tenderness  Lungs:     Clear to auscultation bilaterally without wheezes, rales or     ronchi; respirations unlabored  Chest Wall:    No tenderness or deformity   Heart:    Regular rate and rhythm, S1 and S2 normal, no murmur, rub   or gallop  Breast Exam:    No chest wall tenderness, masses or gynecomastia  Abdomen:     Soft, non-tender, nondistended, normoactive bowel sounds,    no masses, no hepatosplenomegaly  Genitalia:     Normal male external genitalia without lesions.  Testicles without masses.  No inguinal hernias.  Rectal:    Normal sphincter tone, no masses or tenderness; guaiac negative stool.  Prostate smooth, no nodules, not enlarged.  Extremities:   No clubbing, cyanosis or edema  Pulses:   2+ and symmetric all extremities  Skin:   Skin color, texture, turgor normal, no rashes or lesions  Lymph nodes:   Cervical, supraclavicular, and axillary nodes normal  Neurologic:   CNII-XII intact, normal strength, sensation and gait; reflexes 2+ and symmetric throughout          Psych:   Normal mood, affect, hygiene and grooming.    blood work does show elevated liver enzymes and blood sugar. His hemoglobin A1c is 5.5.       Assessment & Plan:  Routine general medical examination at a health care facility  Elevated liver enzymes  Essential hypertension  Obesity (BMI 30-39.9)  History of colonic polyps  Arthritis  recommend conservative care for the arthritis with the use of Tylenol. Discussed his elevated  Hemoglobin A1c .  I also discussed his elevated liver enzymes in regard to his weight as well as alcohol consumption. Strongly encouraged him to cut back on  alcohol consumption to  No more than 1 beer per day. Encouraged him to get into a weight loss and exercise program to get his waist size down to 34.

## 2014-11-05 ENCOUNTER — Encounter: Payer: Self-pay | Admitting: Family Medicine

## 2014-11-11 ENCOUNTER — Ambulatory Visit (AMBULATORY_SURGERY_CENTER): Payer: Self-pay | Admitting: *Deleted

## 2014-11-11 VITALS — Ht 67.5 in | Wt 223.8 lb

## 2014-11-11 DIAGNOSIS — Z86010 Personal history of colonic polyps: Secondary | ICD-10-CM

## 2014-11-11 MED ORDER — MOVIPREP 100 G PO SOLR: 1.0000 | Freq: Once | ORAL | Status: DC

## 2014-11-11 NOTE — Progress Notes (Signed)
No egg or soy allergy. ewm No home 02 use. ewm No blood thinners. No diet pills. ewm No problems with past sedation. ewm Pt declined emmi. ewm

## 2014-11-18 ENCOUNTER — Encounter: Payer: Self-pay | Admitting: Family Medicine

## 2014-11-25 ENCOUNTER — Encounter: Payer: Self-pay | Admitting: Internal Medicine

## 2014-11-25 ENCOUNTER — Ambulatory Visit (AMBULATORY_SURGERY_CENTER): Payer: 59 | Admitting: Internal Medicine

## 2014-11-25 VITALS — BP 125/69 | HR 82 | Temp 98.9°F | Resp 16 | Ht 67.0 in | Wt 223.0 lb

## 2014-11-25 DIAGNOSIS — D123 Benign neoplasm of transverse colon: Secondary | ICD-10-CM

## 2014-11-25 DIAGNOSIS — Z86010 Personal history of colonic polyps: Secondary | ICD-10-CM

## 2014-11-25 HISTORY — PX: COLONOSCOPY: SHX174

## 2014-11-25 MED ORDER — SODIUM CHLORIDE 0.9 % IV SOLN: 500.0000 mL | INTRAVENOUS | Status: DC

## 2014-11-25 NOTE — Progress Notes (Signed)
Called to room to assist during endoscopic procedure.  Patient ID and intended procedure confirmed with present staff. Received instructions for my participation in the procedure from the performing physician.  

## 2014-11-25 NOTE — Patient Instructions (Signed)

## 2014-11-25 NOTE — Op Note (Signed)
[facility]  [address]. [place], [address]   COLONOSCOPY PROCEDURE REPORT  PATIENT: Christopher Craig, Christopher Craig  MR#: [id number] BIRTHDATE: 05/29/1961 , 37  yrs. old GENDER: male ENDOSCOPIST: Jerene Bears, MD PROCEDURE DATE:  11/25/2014 PROCEDURE:   Colonoscopy with cold biopsy polypectomy First Screening Colonoscopy - Avg.  risk and is 50 yrs.  old or older - No.  Prior Negative Screening - Now for repeat screening. N/A  History of Adenoma - Now for follow-up colonoscopy & has been > or = to 3 yrs.  No.  It has been less than 3 yrs since last colonoscopy.  Medical reason.  Polyps Removed Today? Yes. ASA CLASS:   Class II INDICATIONS:surveillance colonoscopy based on a history of adenomatous colonic polyp(s). MEDICATIONS: Monitored anesthesia care and Propofol 450 mg IV  DESCRIPTION OF PROCEDURE:   After the risks benefits and alternatives of the procedure were thoroughly explained, informed consent was obtained.  The digital rectal exam revealed no rectal mass.   The LB [id number]  endoscope was introduced through the anus and advanced to the cecum, which was identified by both the appendix and ileocecal valve. No adverse events experienced. The quality of the prep was good, using MoviPrep  The instrument was then slowly withdrawn as the colon was fully examined.   COLON FINDINGS: Two sessile polyps ranging between 3-83mm in size were found in the transverse colon.  Polypectomies were performed with cold forceps.  The resection was complete, the polyp tissue was completely retrieved and sent to histology.   There was mild diverticulosis noted in the transverse colon, ascending colon, sigmoid colon, and descending colon with associated muscular hypertrophy.  Retroflexed views revealed internal hemorrhoids. The time to cecum=3 minutes 11 seconds.  Withdrawal time=15 minutes 23 seconds.  The scope was withdrawn and the procedure completed. COMPLICATIONS: There were  no immediate complications.  ENDOSCOPIC IMPRESSION: 1.   Two sessile polyps ranging between 3-37mm in size were found in the transverse colon; polypectomies were performed with cold forceps 2.   There was mild diverticulosis noted in the transverse colon, ascending colon, sigmoid colon, and descending colon  RECOMMENDATIONS: 1.  Await pathology results 2.  High fiber diet 3.  Timing of repeat colonoscopy will be determined by pathology findings. 4.  You will receive a letter within 1-2 weeks with the results of your biopsy as well as final recommendations.  Please call my office if you have not received a letter after 3 weeks.  eSigned:  Jerene Bears, MD 11/25/2014 11:48 AM  cc: The Patient and Jill Alexanders, MD

## 2014-11-25 NOTE — Progress Notes (Signed)
A/ox3, pleased with MAC, report to RN 

## 2014-11-26 ENCOUNTER — Telehealth: Payer: Self-pay | Admitting: *Deleted

## 2014-11-26 NOTE — Telephone Encounter (Signed)
  Follow up Call-  Call back number 11/25/2014 10/27/2013  Post procedure Call Back phone  # [phone]  Permission to leave phone message Yes Yes     Patient questions:  Do you have a fever, pain , or abdominal swelling? No. Pain Score  0 *  Have you tolerated food without any problems? Yes.    Have you been able to return to your normal activities? Yes.    Do you have any questions about your discharge instructions: Diet   No. Medications  No. Follow up visit  No.  Do you have questions or concerns about your Care? No.  Actions: * If pain score is 4 or above: No action needed, pain <4.

## 2014-12-02 ENCOUNTER — Encounter: Payer: Self-pay | Admitting: Internal Medicine

## 2014-12-17 ENCOUNTER — Encounter: Payer: Self-pay | Admitting: Family Medicine

## 2014-12-23 ENCOUNTER — Encounter: Payer: Self-pay | Admitting: Family Medicine

## 2014-12-29 ENCOUNTER — Other Ambulatory Visit: Payer: Self-pay | Admitting: Family Medicine

## 2014-12-29 MED ORDER — ATENOLOL-CHLORTHALIDONE 100-25 MG PO TABS: 1.0000 | ORAL_TABLET | Freq: Every day | ORAL | Status: DC

## 2014-12-29 MED ORDER — LISINOPRIL 10 MG PO TABS
10.0000 mg | ORAL_TABLET | Freq: Every day | ORAL | Status: DC
Start: 2014-12-29 — End: 2015-05-04

## 2015-01-22 ENCOUNTER — Encounter: Payer: Self-pay | Admitting: Family Medicine

## 2015-05-04 ENCOUNTER — Other Ambulatory Visit: Payer: Self-pay | Admitting: Family Medicine

## 2015-05-04 MED ORDER — LISINOPRIL 10 MG PO TABS: 10.0000 mg | ORAL_TABLET | Freq: Every day | ORAL | Status: DC

## 2015-05-04 MED ORDER — ATENOLOL-CHLORTHALIDONE 100-25 MG PO TABS: 1.0000 | ORAL_TABLET | Freq: Every day | ORAL | Status: DC

## 2015-05-31 ENCOUNTER — Ambulatory Visit (INDEPENDENT_AMBULATORY_CARE_PROVIDER_SITE_OTHER): Payer: 59 | Admitting: Family Medicine

## 2015-05-31 ENCOUNTER — Encounter: Payer: Self-pay | Admitting: Family Medicine

## 2015-05-31 VITALS — BP 142/90 | HR 80 | Wt 222.0 lb

## 2015-05-31 DIAGNOSIS — W57XXXA Bitten or stung by nonvenomous insect and other nonvenomous arthropods, initial encounter: Secondary | ICD-10-CM

## 2015-05-31 DIAGNOSIS — S70362A Insect bite (nonvenomous), left thigh, initial encounter: Secondary | ICD-10-CM | POA: Diagnosis not present

## 2015-05-31 NOTE — Progress Notes (Signed)
   Subjective:    Patient ID: Christopher Craig, male    DOB: 11-14-1961, 54 y.o.   MRN: [id number]  HPI This morning and had no symptoms but has a day 1 on he did note left upper thigh discomfort. He then noted some erythema and was able to express some reddish-brown material from it. He does not remember getting bit by anything.   Review of Systems     Objective:   Physical Exam  Central slightly raised lesion is noted with surrounding erythema of a proximally 3-4 cm.       Assessment & Plan:  Insect bite of thigh with local reaction, left, initial encounter I explained that it is difficult to know with this is local venomous reaction versus an infection. The lesion was with an ink pen. If he has further erythema and warmth tomorrow he will call me for possible antibody. He is comfortable with that.

## 2015-07-12 ENCOUNTER — Encounter: Payer: Self-pay | Admitting: Family Medicine

## 2015-07-13 NOTE — Telephone Encounter (Signed)
Called pt and informed samples are ready to be picked up.

## 2015-07-16 ENCOUNTER — Encounter: Payer: Self-pay | Admitting: Family Medicine

## 2015-07-22 ENCOUNTER — Encounter: Payer: Self-pay | Admitting: Family Medicine

## 2015-07-22 MED ORDER — CLOBETASOL PROPIONATE 0.05 % EX CREA
1.0000 | TOPICAL_CREAM | Freq: Two times a day (BID) | CUTANEOUS | Status: DC
Start: 2015-07-22 — End: 2015-11-20

## 2015-08-18 ENCOUNTER — Other Ambulatory Visit: Payer: Self-pay | Admitting: Family Medicine

## 2015-08-18 ENCOUNTER — Telehealth: Payer: Self-pay | Admitting: Family Medicine

## 2015-08-18 ENCOUNTER — Encounter: Payer: Self-pay | Admitting: Family Medicine

## 2015-08-18 DIAGNOSIS — L309 Dermatitis, unspecified: Secondary | ICD-10-CM

## 2015-08-18 MED ORDER — BETAMETHASONE VALERATE 0.1 % EX OINT: TOPICAL_OINTMENT | Freq: Two times a day (BID) | CUTANEOUS | Status: DC

## 2015-08-18 NOTE — Telephone Encounter (Signed)
Pt called and seen on his my chart that he was due for a flu shot but he had one last week through his job at [employer], just wanted to let someone no,

## 2015-08-18 NOTE — Telephone Encounter (Signed)
Put it in the chart that he had it around this time last week

## 2015-08-23 ENCOUNTER — Encounter: Payer: Self-pay | Admitting: Family Medicine

## 2015-08-24 ENCOUNTER — Ambulatory Visit (INDEPENDENT_AMBULATORY_CARE_PROVIDER_SITE_OTHER): Payer: 59 | Admitting: Family Medicine

## 2015-08-24 ENCOUNTER — Encounter: Payer: Self-pay | Admitting: Family Medicine

## 2015-08-24 VITALS — BP 140/90 | HR 78 | Ht 66.5 in | Wt 221.0 lb

## 2015-08-24 DIAGNOSIS — M1990 Unspecified osteoarthritis, unspecified site: Secondary | ICD-10-CM

## 2015-08-24 DIAGNOSIS — M6740 Ganglion, unspecified site: Secondary | ICD-10-CM | POA: Diagnosis not present

## 2015-08-24 DIAGNOSIS — Z1159 Encounter for screening for other viral diseases: Secondary | ICD-10-CM | POA: Diagnosis not present

## 2015-08-24 LAB — HEPATITIS C ANTIBODY: HCV AB: NEGATIVE

## 2015-08-24 NOTE — Progress Notes (Signed)
   Subjective:    Patient ID: Christopher Craig, male    DOB: Feb 26, 1961, 54 y.o.   MRN: [id number]  HPI He complains of a cystic lesion on the dorsal surface of his right hand. He thinks this might be causing his hand did become stiff. He notes stiffness in the morning but does get better as the day goes on. He has had a carpal tunnel injection several months ago for CTS type symptoms that the symptoms are mainly in the morning and did get better as the day goes on. Also review of his record indicates he's not had hepatitis C screening.   Review of Systems     Objective:   Physical Exam Alert and in no distress. Exam of the right wrist does show a 1-2 cm cystic lesion that is nontender and proximal to the wrist joint. Full motion of the fingers. No joint swelling noted.       Assessment & Plan:  Arthritis  Need for hepatitis C screening test - Plan: Hepatitis C Antibody  Ganglion cyst  I explained that the cyst is not causing his symptoms and they're most likely related to arthritis. Recommend Tylenol and if no improvement then Aleve. Hepatitis C screening was done. He did get a flu shot to work.

## 2015-09-20 ENCOUNTER — Encounter: Payer: Self-pay | Admitting: Family Medicine

## 2015-09-20 ENCOUNTER — Other Ambulatory Visit: Payer: Self-pay | Admitting: Family Medicine

## 2015-09-20 MED ORDER — ATENOLOL-CHLORTHALIDONE 100-25 MG PO TABS: 1.0000 | ORAL_TABLET | Freq: Every day | ORAL | Status: DC

## 2015-09-20 MED ORDER — LISINOPRIL 10 MG PO TABS: 10.0000 mg | ORAL_TABLET | Freq: Every day | ORAL | Status: DC

## 2015-09-20 NOTE — Addendum Note (Signed)
Addended by: Denita Lung on: 09/20/2015 09:04 AM   Modules accepted: Orders

## 2015-11-02 ENCOUNTER — Telehealth: Payer: Self-pay

## 2015-11-02 NOTE — Telephone Encounter (Signed)
Pt says he needs a physical done before the 30th. He is having surgery for right index trigger finger at [facility] with [facility] Dr. Kerry Kass III on the 30th @11 :30. He says he is only wanting it to be a physical so he wouldn't have to pay for the labs they need. He says the insurance he has won't make him pay for the labs if he has a physical and he doesn't want to pay the cost for just labs. The thing is, there is no availability for a physical until next year unless you are willing to add him. He wants it to be after 3 in the afternoon.

## 2015-11-03 ENCOUNTER — Encounter: Payer: Self-pay | Admitting: Family Medicine

## 2015-11-10 ENCOUNTER — Encounter: Payer: Self-pay | Admitting: Family Medicine

## 2015-11-10 ENCOUNTER — Encounter (HOSPITAL_BASED_OUTPATIENT_CLINIC_OR_DEPARTMENT_OTHER): Payer: Self-pay | Admitting: *Deleted

## 2015-11-10 ENCOUNTER — Other Ambulatory Visit: Payer: Self-pay | Admitting: Family Medicine

## 2015-11-10 DIAGNOSIS — I10 Essential (primary) hypertension: Secondary | ICD-10-CM

## 2015-11-10 DIAGNOSIS — E669 Obesity, unspecified: Secondary | ICD-10-CM

## 2015-11-11 ENCOUNTER — Telehealth: Payer: Self-pay | Admitting: Family Medicine

## 2015-11-11 NOTE — Telephone Encounter (Addendum)
Called pt to advise that lab order faxed over to [facility]. Pt then stated that lab has contacted him letting him know that they will need billing code. Faxed order again with cpt code for [place].

## 2015-11-11 NOTE — Telephone Encounter (Signed)
I put it in the system but they probably can't see it in Epic from our side. Find out what we need to do and if I need to fax an order somewhere let me know.

## 2015-11-11 NOTE — Telephone Encounter (Signed)
Faxed C[facility] lab order to [employer] at [phone] as requested by pt. Per Dr Redmond School, this order should cover the Bmet test that the pt requested.

## 2015-11-11 NOTE — Telephone Encounter (Signed)
Pt called stating that he requested a lab order for a BMP by email  from Dr Redmond School so that he can get this test done at [facility] where he works and the hospital is not seeing the order so pt requesting again and the hospital would like for the order to be faxed to [phone]. Please call pt at [phone] when this is done so that he will know when he can go get lab done.

## 2015-11-12 ENCOUNTER — Other Ambulatory Visit (HOSPITAL_COMMUNITY)
Admission: RE | Admit: 2015-11-12 | Discharge: 2015-11-12 | Disposition: A | Discharge status: Home / Self Care | Payer: 59 | Source: Ambulatory Visit | Attending: Family Medicine | Admitting: Family Medicine

## 2015-11-12 DIAGNOSIS — I10 Essential (primary) hypertension: Secondary | ICD-10-CM | POA: Diagnosis present

## 2015-11-12 LAB — COMPREHENSIVE METABOLIC PANEL
ALK PHOS: 132 U/L — AB (ref 38–126)
ALT: 67 U/L — AB (ref 17–63)
AST: 81 U/L — AB (ref 15–41)
Albumin: 3.8 g/dL (ref 3.5–5.0)
Anion gap: 12 (ref 5–15)
BILIRUBIN TOTAL: 0.7 mg/dL (ref 0.3–1.2)
BUN: 14 mg/dL (ref 6–20)
CALCIUM: 9.6 mg/dL (ref 8.9–10.3)
CHLORIDE: 99 mmol/L — AB (ref 101–111)
CO2: 26 mmol/L (ref 22–32)
CREATININE: 0.9 mg/dL (ref 0.61–1.24)
GFR calc Af Amer: >60 mL/min (ref >60)
Glucose, Bld: 111 mg/dL — ABNORMAL HIGH (ref 65–99)
Potassium: 3.7 mmol/L (ref 3.5–5.1)
Sodium: 137 mmol/L (ref 135–145)
Total Protein: 7.4 g/dL (ref 6.5–8.1)

## 2015-11-17 ENCOUNTER — Other Ambulatory Visit: Payer: Self-pay | Admitting: Orthopedic Surgery

## 2015-11-19 ENCOUNTER — Encounter (HOSPITAL_BASED_OUTPATIENT_CLINIC_OR_DEPARTMENT_OTHER): Payer: Self-pay | Admitting: Anesthesiology

## 2015-11-19 DIAGNOSIS — Z79899 Other long term (current) drug therapy: Secondary | ICD-10-CM | POA: Diagnosis not present

## 2015-11-19 DIAGNOSIS — E669 Obesity, unspecified: Secondary | ICD-10-CM | POA: Diagnosis not present

## 2015-11-19 DIAGNOSIS — Z6833 Body mass index (BMI) 33.0-33.9, adult: Secondary | ICD-10-CM | POA: Diagnosis not present

## 2015-11-19 DIAGNOSIS — I10 Essential (primary) hypertension: Secondary | ICD-10-CM | POA: Diagnosis not present

## 2015-11-19 DIAGNOSIS — Z7982 Long term (current) use of aspirin: Secondary | ICD-10-CM | POA: Diagnosis not present

## 2015-11-19 DIAGNOSIS — M65321 Trigger finger, right index finger: Secondary | ICD-10-CM | POA: Diagnosis not present

## 2015-11-19 DIAGNOSIS — M65841 Other synovitis and tenosynovitis, right hand: Secondary | ICD-10-CM | POA: Diagnosis present

## 2015-11-19 DIAGNOSIS — J309 Allergic rhinitis, unspecified: Secondary | ICD-10-CM | POA: Diagnosis not present

## 2015-11-19 MED ORDER — GLYCOPYRROLATE 0.2 MG/ML IJ SOLN
INTRAMUSCULAR | Status: AC
  Filled 2015-11-19: qty 1

## 2015-11-19 MED ORDER — FENTANYL CITRATE (PF) 100 MCG/2ML IJ SOLN
INTRAMUSCULAR | Status: AC
  Filled 2015-11-19: qty 2

## 2015-11-19 MED ORDER — PROPOFOL 10 MG/ML IV BOLUS
INTRAVENOUS | Status: AC
  Filled 2015-11-19: qty 40

## 2015-11-19 MED ORDER — LIDOCAINE HCL (CARDIAC) 20 MG/ML IV SOLN
INTRAVENOUS | Status: AC
  Filled 2015-11-19: qty 5

## 2015-11-19 MED ORDER — EPHEDRINE SULFATE 50 MG/ML IJ SOLN
INTRAMUSCULAR | Status: AC
  Filled 2015-11-19: qty 1

## 2015-11-19 MED ORDER — SODIUM BICARBONATE 4 % IV SOLN
INTRAVENOUS | Status: AC
  Filled 2015-11-19: qty 5

## 2015-11-19 MED ORDER — MIDAZOLAM HCL 2 MG/2ML IJ SOLN
INTRAMUSCULAR | Status: AC
  Filled 2015-11-19: qty 2

## 2015-11-19 MED ORDER — PHENYLEPHRINE HCL 10 MG/ML IJ SOLN
INTRAMUSCULAR | Status: AC
  Filled 2015-11-19: qty 1

## 2015-11-19 MED ORDER — SUCCINYLCHOLINE CHLORIDE 20 MG/ML IJ SOLN
INTRAMUSCULAR | Status: AC
  Filled 2015-11-19: qty 1

## 2015-11-19 MED ORDER — DEXAMETHASONE SODIUM PHOSPHATE 10 MG/ML IJ SOLN
INTRAMUSCULAR | Status: AC
  Filled 2015-11-19: qty 1

## 2015-11-19 MED ORDER — BUPIVACAINE HCL (PF) 0.25 % IJ SOLN
INTRAMUSCULAR | Status: AC
  Filled 2015-11-19: qty 30

## 2015-11-19 MED ORDER — LIDOCAINE HCL (PF) 1 % IJ SOLN
INTRAMUSCULAR | Status: AC
  Filled 2015-11-19: qty 30

## 2015-11-19 MED ORDER — ATROPINE SULFATE 0.4 MG/ML IJ SOLN
INTRAMUSCULAR | Status: AC
  Filled 2015-11-19: qty 1

## 2015-11-19 MED ORDER — ONDANSETRON HCL 4 MG/2ML IJ SOLN
INTRAMUSCULAR | Status: AC
  Filled 2015-11-19: qty 2

## 2015-11-19 NOTE — Anesthesia Preprocedure Evaluation (Addendum)
Anesthesia Evaluation  Patient identified by MRN, date of birth, ID band Patient awake    Reviewed: Allergy & Precautions, H&P , NPO status , Patient's Chart, lab work & pertinent test results  History of Anesthesia Complications Negative for: history of anesthetic complications  Airway Mallampati: II  TM Distance: >3 FB Neck ROM: full    Dental no notable dental hx. (+) Teeth Intact   Pulmonary neg pulmonary ROS,    Pulmonary exam normal breath sounds clear to auscultation       Cardiovascular hypertension, Pt. on medications Normal cardiovascular exam Rhythm:regular Rate:Normal     Neuro/Psych negative neurological ROS     GI/Hepatic negative GI ROS, Neg liver ROS,   Endo/Other  obesity  Renal/GU negative Renal ROS     Musculoskeletal   Abdominal   Peds  Hematology negative hematology ROS (+)   Anesthesia Other Findings   Reproductive/Obstetrics negative OB ROS                            Anesthesia Physical Anesthesia Plan  ASA: II  Anesthesia Plan: General   Post-op Pain Management:    Induction: Intravenous  Airway Management Planned: LMA  Additional Equipment:   Intra-op Plan:   Post-operative Plan: Extubation in OR  Informed Consent: I have reviewed the patients History and Physical, chart, labs and discussed the procedure including the risks, benefits and alternatives for the proposed anesthesia with the patient or authorized representative who has indicated his/her understanding and acceptance.   Dental Advisory Given  Plan Discussed with: Anesthesiologist and CRNA  Anesthesia Plan Comments:         Anesthesia Quick Evaluation

## 2015-11-19 NOTE — Progress Notes (Signed)
Pt denies SOB, chest pain, and being under the care of a cardiologist. Pt denies having a stress test, echo and cardiac cath. Pt to bring EKG on DOS. Pt made aware to stop taking Aspirin, otc vitamins, fish oil and herbal medications. Do not take any NSAIDs ie: Ibuprofen, Advil, Naproxen or any medication containing Aspirin. Pt verbalized understanding of all pre-op instructions.

## 2015-11-20 ENCOUNTER — Ambulatory Visit (HOSPITAL_BASED_OUTPATIENT_CLINIC_OR_DEPARTMENT_OTHER)
Admission: RE | Admit: 2015-11-20 | Discharge: 2015-11-20 | Disposition: A | Discharge status: Home / Self Care | Payer: 59 | Source: Ambulatory Visit | Attending: Orthopedic Surgery | Admitting: Orthopedic Surgery

## 2015-11-20 ENCOUNTER — Encounter (HOSPITAL_COMMUNITY): Payer: Self-pay | Admitting: *Deleted

## 2015-11-20 ENCOUNTER — Ambulatory Visit (HOSPITAL_COMMUNITY): Payer: 59 | Admitting: Anesthesiology

## 2015-11-20 ENCOUNTER — Encounter (HOSPITAL_COMMUNITY)
Admission: RE | Disposition: A | Discharge status: Home / Self Care | Payer: 59 | Source: Ambulatory Visit | Attending: Orthopedic Surgery

## 2015-11-20 DIAGNOSIS — Z6833 Body mass index (BMI) 33.0-33.9, adult: Secondary | ICD-10-CM | POA: Insufficient documentation

## 2015-11-20 DIAGNOSIS — E669 Obesity, unspecified: Secondary | ICD-10-CM | POA: Insufficient documentation

## 2015-11-20 DIAGNOSIS — M65841 Other synovitis and tenosynovitis, right hand: Secondary | ICD-10-CM | POA: Insufficient documentation

## 2015-11-20 DIAGNOSIS — Z7982 Long term (current) use of aspirin: Secondary | ICD-10-CM | POA: Insufficient documentation

## 2015-11-20 DIAGNOSIS — I10 Essential (primary) hypertension: Secondary | ICD-10-CM | POA: Insufficient documentation

## 2015-11-20 DIAGNOSIS — Z79899 Other long term (current) drug therapy: Secondary | ICD-10-CM | POA: Insufficient documentation

## 2015-11-20 DIAGNOSIS — M65321 Trigger finger, right index finger: Secondary | ICD-10-CM | POA: Diagnosis not present

## 2015-11-20 DIAGNOSIS — J309 Allergic rhinitis, unspecified: Secondary | ICD-10-CM | POA: Insufficient documentation

## 2015-11-20 HISTORY — PX: TRIGGER FINGER RELEASE: SHX641

## 2015-11-20 HISTORY — PX: TENOSYNOVECTOMY: SHX6110

## 2015-11-20 SURGERY — RELEASE, A1 PULLEY, FOR TRIGGER FINGER
Anesthesia: General | Site: Finger | Laterality: Right

## 2015-11-20 MED ORDER — MIDAZOLAM HCL 2 MG/2ML IJ SOLN
INTRAMUSCULAR | Status: AC
  Filled 2015-11-20: qty 2

## 2015-11-20 MED ORDER — OXYCODONE HCL 5 MG PO TABS: 10.0000 mg | ORAL_TABLET | ORAL | Status: DC | PRN

## 2015-11-20 MED ORDER — SULFAMETHOXAZOLE-TRIMETHOPRIM 800-160 MG PO TABS: 1.0000 | ORAL_TABLET | Freq: Two times a day (BID) | ORAL | Status: DC

## 2015-11-20 MED ORDER — PROPOFOL 10 MG/ML IV BOLUS
INTRAVENOUS | Status: AC
  Filled 2015-11-20: qty 20

## 2015-11-20 MED ORDER — ONDANSETRON HCL 4 MG/2ML IJ SOLN
INTRAMUSCULAR | Status: AC
  Filled 2015-11-20: qty 2

## 2015-11-20 MED ORDER — LIDOCAINE HCL (CARDIAC) 20 MG/ML IV SOLN
INTRAVENOUS | Status: AC
  Filled 2015-11-20: qty 5

## 2015-11-20 MED ORDER — PROPOFOL 10 MG/ML IV BOLUS
INTRAVENOUS | Status: DC | PRN
  Administered 2015-11-20: 200 mg via INTRAVENOUS

## 2015-11-20 MED ORDER — FENTANYL CITRATE (PF) 100 MCG/2ML IJ SOLN
INTRAMUSCULAR | Status: DC | PRN
  Administered 2015-11-20 (×2): 50 ug via INTRAVENOUS

## 2015-11-20 MED ORDER — BUPIVACAINE HCL (PF) 0.25 % IJ SOLN
INTRAMUSCULAR | Status: AC
  Filled 2015-11-20: qty 30

## 2015-11-20 MED ORDER — FENTANYL CITRATE (PF) 100 MCG/2ML IJ SOLN: 25.0000 ug | INTRAMUSCULAR | Status: DC | PRN

## 2015-11-20 MED ORDER — SODIUM CHLORIDE 0.9 % IJ SOLN
INTRAMUSCULAR | Status: AC
  Filled 2015-11-20: qty 10

## 2015-11-20 MED ORDER — LACTATED RINGERS IV SOLN
INTRAVENOUS | Status: DC
  Administered 2015-11-20: 08:00:00 via INTRAVENOUS

## 2015-11-20 MED ORDER — EPHEDRINE SULFATE 50 MG/ML IJ SOLN
INTRAMUSCULAR | Status: AC
  Filled 2015-11-20: qty 1

## 2015-11-20 MED ORDER — CHLORHEXIDINE GLUCONATE 4 % EX LIQD: 60.0000 mL | Freq: Once | CUTANEOUS | Status: DC

## 2015-11-20 MED ORDER — BUPIVACAINE HCL (PF) 0.25 % IJ SOLN
INTRAMUSCULAR | Status: DC | PRN
  Administered 2015-11-20: 10 mL

## 2015-11-20 MED ORDER — ONDANSETRON HCL 4 MG/2ML IJ SOLN: 4.0000 mg | Freq: Once | INTRAMUSCULAR | Status: DC | PRN

## 2015-11-20 MED ORDER — MIDAZOLAM HCL 5 MG/5ML IJ SOLN
INTRAMUSCULAR | Status: DC | PRN
  Administered 2015-11-20: 2 mg via INTRAVENOUS

## 2015-11-20 MED ORDER — LIDOCAINE HCL (CARDIAC) 20 MG/ML IV SOLN
INTRAVENOUS | Status: DC | PRN
  Administered 2015-11-20: 40 mg via INTRAVENOUS

## 2015-11-20 MED ORDER — PHENYLEPHRINE 40 MCG/ML (10ML) SYRINGE FOR IV PUSH (FOR BLOOD PRESSURE SUPPORT)
PREFILLED_SYRINGE | INTRAVENOUS | Status: AC
  Filled 2015-11-20: qty 10

## 2015-11-20 MED ORDER — FENTANYL CITRATE (PF) 250 MCG/5ML IJ SOLN
INTRAMUSCULAR | Status: AC
  Filled 2015-11-20: qty 5

## 2015-11-20 MED ORDER — SUCCINYLCHOLINE CHLORIDE 20 MG/ML IJ SOLN
INTRAMUSCULAR | Status: AC
  Filled 2015-11-20: qty 1

## 2015-11-20 MED ORDER — 0.9 % SODIUM CHLORIDE (POUR BTL) OPTIME
TOPICAL | Status: DC | PRN
  Administered 2015-11-20: 1000 mL

## 2015-11-20 MED ORDER — CEFAZOLIN SODIUM-DEXTROSE 2-3 GM-% IV SOLR
2.0000 g | INTRAVENOUS | Status: AC
  Administered 2015-11-20: 2 g via INTRAVENOUS

## 2015-11-20 MED ORDER — ONDANSETRON HCL 4 MG/2ML IJ SOLN
INTRAMUSCULAR | Status: DC | PRN
Start: 2015-11-20 — End: 2015-11-20
  Administered 2015-11-20: 4 mg via INTRAVENOUS

## 2015-11-20 MED ORDER — PHENYLEPHRINE HCL 10 MG/ML IJ SOLN
INTRAMUSCULAR | Status: DC | PRN
  Administered 2015-11-20 (×3): 80 ug via INTRAVENOUS

## 2015-11-20 SURGICAL SUPPLY — 56 items
BANDAGE ACE 3X5.8 VEL STRL LF (GAUZE/BANDAGES/DRESSINGS) ×2 IMPLANT
BANDAGE ELASTIC 3 VELCRO ST LF (GAUZE/BANDAGES/DRESSINGS) IMPLANT
BANDAGE ELASTIC 4 VELCRO ST LF (GAUZE/BANDAGES/DRESSINGS) IMPLANT
BNDG COHESIVE 1X5 TAN STRL LF (GAUZE/BANDAGES/DRESSINGS) IMPLANT
BNDG CONFORM 3 STRL LF (GAUZE/BANDAGES/DRESSINGS) ×2 IMPLANT
BNDG GAUZE ELAST 4 BULKY (GAUZE/BANDAGES/DRESSINGS) ×2 IMPLANT
CORDS BIPOLAR (ELECTRODE) ×2 IMPLANT
COVER SURGICAL LIGHT HANDLE (MISCELLANEOUS) ×2 IMPLANT
CUFF TOURNIQUET SINGLE 18IN (TOURNIQUET CUFF) ×2 IMPLANT
CUFF TOURNIQUET SINGLE 24IN (TOURNIQUET CUFF) IMPLANT
DECANTER SPIKE VIAL GLASS SM (MISCELLANEOUS) ×2 IMPLANT
DRAPE SURG 17X23 STRL (DRAPES) ×2 IMPLANT
DRSG EMULSION OIL 3X3 NADH (GAUZE/BANDAGES/DRESSINGS) ×2 IMPLANT
GAUZE SPONGE 2X2 8PLY STRL LF (GAUZE/BANDAGES/DRESSINGS) IMPLANT
GAUZE SPONGE 4X4 12PLY STRL (GAUZE/BANDAGES/DRESSINGS) IMPLANT
GAUZE XEROFORM 1X8 LF (GAUZE/BANDAGES/DRESSINGS) ×2 IMPLANT
GLOVE BIO SURGEON STRL SZ7 (GLOVE) ×2 IMPLANT
GLOVE BIOGEL M STRL SZ7.5 (GLOVE) ×2 IMPLANT
GLOVE BIOGEL PI IND STRL 7.0 (GLOVE) ×2 IMPLANT
GLOVE BIOGEL PI INDICATOR 7.0 (GLOVE) ×2
GLOVE SS BIOGEL STRL SZ 8 (GLOVE) ×1 IMPLANT
GLOVE SUPERSENSE BIOGEL SZ 8 (GLOVE) ×1
GLOVE SURG SS PI 7.0 STRL IVOR (GLOVE) ×2 IMPLANT
GOWN STRL REUS W/ TWL LRG LVL3 (GOWN DISPOSABLE) ×2 IMPLANT
GOWN STRL REUS W/ TWL XL LVL3 (GOWN DISPOSABLE) ×1 IMPLANT
GOWN STRL REUS W/TWL LRG LVL3 (GOWN DISPOSABLE) ×2
GOWN STRL REUS W/TWL XL LVL3 (GOWN DISPOSABLE) ×1
KIT BASIN OR (CUSTOM PROCEDURE TRAY) ×2 IMPLANT
KIT ROOM TURNOVER OR (KITS) ×2 IMPLANT
MANIFOLD NEPTUNE II (INSTRUMENTS) IMPLANT
NEEDLE HYPO 25GX1X1/2 BEV (NEEDLE) ×2 IMPLANT
NS IRRIG 1000ML POUR BTL (IV SOLUTION) ×2 IMPLANT
PACK ORTHO EXTREMITY (CUSTOM PROCEDURE TRAY) ×2 IMPLANT
PAD ARMBOARD 7.5X6 YLW CONV (MISCELLANEOUS) IMPLANT
PAD CAST 3X4 CTTN HI CHSV (CAST SUPPLIES) ×1 IMPLANT
PAD CAST 4YDX4 CTTN HI CHSV (CAST SUPPLIES) IMPLANT
PADDING CAST COTTON 3X4 STRL (CAST SUPPLIES) ×1
PADDING CAST COTTON 4X4 STRL (CAST SUPPLIES)
SOLUTION BETADINE 4OZ (MISCELLANEOUS) ×2 IMPLANT
SPECIMEN JAR SMALL (MISCELLANEOUS) IMPLANT
SPONGE GAUZE 2X2 STER 10/PKG (GAUZE/BANDAGES/DRESSINGS)
SPONGE GAUZE 4X4 12PLY STER LF (GAUZE/BANDAGES/DRESSINGS) ×2 IMPLANT
SPONGE SCRUB IODOPHOR (GAUZE/BANDAGES/DRESSINGS) ×2 IMPLANT
SUCTION FRAZIER TIP 10 FR DISP (SUCTIONS) ×2 IMPLANT
SUT MERSILENE 4 0 P 3 (SUTURE) IMPLANT
SUT PROLENE 4 0 PS 2 18 (SUTURE) ×2 IMPLANT
SUT VIC AB 2-0 CT1 27 (SUTURE)
SUT VIC AB 2-0 CT1 TAPERPNT 27 (SUTURE) IMPLANT
SWAB COLLECTION DEVICE MRSA (MISCELLANEOUS) ×2 IMPLANT
SWAB CULTURE ESWAB REG 1ML (MISCELLANEOUS) ×2 IMPLANT
SYR CONTROL 10ML LL (SYRINGE) ×2 IMPLANT
TOWEL OR 17X24 6PK STRL BLUE (TOWEL DISPOSABLE) ×2 IMPLANT
TOWEL OR 17X26 10 PK STRL BLUE (TOWEL DISPOSABLE) ×2 IMPLANT
TUBE CONNECTING 12X1/4 (SUCTIONS) IMPLANT
UNDERPAD 30X30 INCONTINENT (UNDERPADS AND DIAPERS) ×2 IMPLANT
WATER STERILE IRR 1000ML POUR (IV SOLUTION) IMPLANT

## 2015-11-20 NOTE — Op Note (Signed)
NAME:  Christopher Craig, DULEY NO.:  [id number]  MEDICAL RECORD NO.:  [id number]  LOCATION:  [facility]                         FACILITY:  [facility]  PHYSICIAN:  Laderrius Shelton. 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  PROCEDURE: 1. Right index finger flexor digitorum profundus tenosynovectomy. 2. Right index finger flexor digitorum superficialis tenosynovectomy     extensive in nature, right index finger. 3. A1 pulley release right index finger with portion of the proximal     edge of the A2 pulley released as well given the significant     inflammatory changes.  SURGEON:  Satira Anis. Amedeo Plenty, MD  ASSISTANT:  None.  COMPLICATION:  None.  ANESTHESIA:  General.  TOURNIQUET TIME:  Less than an hour.  SPECIMENS:  I sent cultures given the exuberant tenosynovitis, aerobic, anaerobic, and atypical mycobacterial cultures.  INDICATIONS:  Mr. Christopher Craig has failed conservative management for his tenosynovitic issue.  He has a very robust swelling about the finger more so than just atypical triggering process.  I have discussed with him the risks and benefits of surgery, my plan for a tenosynovectomy given the extensive disease process.  He desires to proceed as he has failed injections, pharmacologic and conservative outpatient care.  OPERATION IN DETAIL:  Patient was seen by myself and Anesthesia, taken to the operative theater and underwent a smooth induction of general anesthetic, laid supine, appropriately padded, prepped and draped in usual sterile fashion with Betadine scrub and paint.  Following this, time-out was called.  The tourniquet was  insufflated and a modified Brunner incision was made overlying the A1 pulley and coursing proximally.  Dissection was carried down.  He had exuberant tenosynovitis proximal to the A1 pulley.  Thus, I released a portion of the A1 pulley and then identified the tenosynovitis change.  With this noted, I performed very careful and cautious look at the area and identified the radial digital nerve, which was intact.  The radial digital nerve was intact.  There were no complicating features about the nerve.  With the nerve  in a stable condition, I then set forth on pursuing the tenosynovectomy.  He had exuberant tenosynovitis proximal to the A1 pulley and thus I extended the incision along the distal palmar crease and performed an extensive tenosynovectomy of the FDP and FDS tendons.  This did not appear to be obviously infectious, but I did send it for culture, aerobic, anaerobic, and atypical mycobacterial cultures.  Following this, I released the remaining portion of the A1 and portions of the A2 pulley to make sure there was absolutely no constriction.  I tunneled distally to ensure that there is no excessive tenosynovitis exchange over the A3 pulley.  The patient looked quite well.  There were no complicating features.  Following this, we then irrigated copiously with a L of saline and ultimately closed the wound after hemostasis was secured with tourniquet  deflated.  Intraoperative findings did not mandate an extension of the incision beyond the MP crease as the A3 pulley and distal end of the A2 pulley looked excellent and with the tendons retracted into the wound in a flexed position, I did not see any advanced tenosynovitis signs distal to the proximal A2 pulley release.  I of course capped portions of the A2 pulley intact as I did not want to create an environment for bow stringing; however we will watch him closely in the postoperative period.  The release was done as necessary  to rid him of the chronic constriction and tenosynovitic process.  Once again, the distal portion of A2 and the A3 pulley were not encroached upon and there was no obvious bowstring in at the conclusion of the case.  We are going to monitor his condition closely, make sure things go smoothly forwardly.  I am going to discharge him on Bactrim DS x7 days and OxyIR p.r.n. pain.  These notes have been discussed and all questions have been encouraged and answered, will be seen back in the office in a week.     Satira Anis. Amedeo Plenty, M.D.     [facility]  D:  11/20/2015  T:  11/20/2015  Job:  [id number]

## 2015-11-20 NOTE — Discharge Instructions (Signed)
Keep your bandage clean and dry and please do not remove it.  You may move your fingers within the confines of your bandage.  Office will call for your follow-up-we will plan to see back in a week  Please take the antibiotics until they're completed and use the pain medicine as needed  Please make sure he uses stool softener with the pain medicine such as Peri-Colace as the pain medicine can constipate you  We recommend that you to take vitamin C 1000 mg a day to promote healing. We also recommend that if you require  pain medicine that you take a stool softener to prevent constipation as most pain medicines will have constipation side effects. We recommend either Peri-Colace or Senokot and recommend that you also consider adding MiraLAX to prevent the constipation affects from pain medicine if you are required to use them. These medicines are over the counter and maybe purchased at a local pharmacy. A cup of yogurt and a probiotic can also be helpful during the recovery process as the medicines can disrupt your intestinal environment. Keep bandage clean and dry.  Call for any problems.  No smoking.  Criteria for driving a car: you should be off your pain medicine for 7-8 hours, able to drive one handed(confident), thinking clearly and feeling able in your judgement to drive. Continue elevation as it will decrease swelling.  If instructed by MD move your fingers within the confines of the bandage/splint.  Use ice if instructed by your MD. Call immediately for any sudden loss of feeling in your hand/arm or change in functional abilities of the extremity.

## 2015-11-20 NOTE — Anesthesia Procedure Notes (Signed)
Procedure Name: LMA Insertion Date/Time: 11/20/2015 9:25 AM Performed by: Rush Farmer E Pre-anesthesia Checklist: Patient identified, Emergency Drugs available, Suction available, Patient being monitored and Timeout performed Patient Re-evaluated:Patient Re-evaluated prior to inductionOxygen Delivery Method: Circle system utilized Preoxygenation: Pre-oxygenation with 100% oxygen Intubation Type: IV induction LMA: LMA inserted LMA Size: 4.0 Number of attempts: 1 Placement Confirmation: positive ETCO2 and breath sounds checked- equal and bilateral Tube secured with: Tape Dental Injury: Teeth and Oropharynx as per pre-operative assessment
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
## 2015-11-20 NOTE — Anesthesia Postprocedure Evaluation (Signed)
Anesthesia Post Note  Patient: Christopher Craig  Procedure(s) Performed: Procedure(s) (LRB): RIGHT INDEX FINGER A-1 RELEASE WITH FLEXOR  (Right) TENOSYNOVECTOMY (Right)  Patient location during evaluation: PACU Anesthesia Type: General Level of consciousness: awake and alert Pain management: pain level controlled Vital Signs Assessment: post-procedure vital signs reviewed and stable Respiratory status: spontaneous breathing, nonlabored ventilation and respiratory function stable Cardiovascular status: blood pressure returned to baseline and stable Postop Assessment: no signs of nausea or vomiting Anesthetic complications: no    Last Vitals:  Filed Vitals:   11/20/15 1040 11/20/15 1055  BP: 120/80 132/85  Pulse: 79 76  Temp:    Resp: 15 15    Last Pain:  Filed Vitals:   11/20/15 1100  PainSc: 2                  Zenaida Deed

## 2015-11-20 NOTE — Op Note (Signed)
See dictation [id number] Amedeo Plenty MD

## 2015-11-20 NOTE — Transfer of Care (Signed)
Immediate Anesthesia Transfer of Care Note  Patient: Christopher Craig  Procedure(s) Performed: Procedure(s): RIGHT INDEX FINGER A-1 RELEASE WITH FLEXOR  (Right) TENOSYNOVECTOMY (Right)  Patient Location: PACU  Anesthesia Type:General  Level of Consciousness: awake, alert  and oriented  Airway & Oxygen Therapy: Patient Spontanous Breathing and Patient connected to nasal cannula oxygen  Post-op Assessment: Report given to RN, Post -op Vital signs reviewed and stable and Patient moving all extremities  Post vital signs: Reviewed and stable  Last Vitals:  Filed Vitals:   11/20/15 0732  BP: 157/99  Pulse: 75  Temp: 36.8 C  Resp: 16    Complications: No apparent anesthesia complications

## 2015-11-23 ENCOUNTER — Encounter (HOSPITAL_COMMUNITY): Payer: Self-pay | Admitting: Orthopedic Surgery

## 2015-11-24 LAB — TISSUE CULTURE
CULTURE: NO GROWTH
Gram Stain: NONE SEEN

## 2015-11-26 LAB — ANAEROBIC CULTURE: GRAM STAIN: NONE SEEN

## 2015-12-02 DIAGNOSIS — Z4789 Encounter for other orthopedic aftercare: Secondary | ICD-10-CM | POA: Diagnosis not present

## 2015-12-03 ENCOUNTER — Encounter: Payer: Self-pay | Admitting: Family Medicine

## 2015-12-10 ENCOUNTER — Telehealth: Payer: Self-pay | Admitting: Family Medicine

## 2015-12-10 NOTE — Telephone Encounter (Signed)
Faxed over to patient and called to let him know

## 2015-12-10 NOTE — Telephone Encounter (Signed)
Pt called and made a cpe appt. 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 He states he usually gets cbc and lipids. Please fax orders to [phone]. Please call pt at [phone] when faxing so he can be aware.

## 2015-12-10 NOTE — Telephone Encounter (Signed)
Take care of this 

## 2015-12-13 ENCOUNTER — Other Ambulatory Visit (HOSPITAL_COMMUNITY)
Admission: RE | Admit: 2015-12-13 | Discharge: 2015-12-13 | Disposition: A | Discharge status: Home / Self Care | Payer: 59 | Source: Ambulatory Visit | Attending: Family Medicine | Admitting: Family Medicine

## 2015-12-13 DIAGNOSIS — Z0000 Encounter for general adult medical examination without abnormal findings: Secondary | ICD-10-CM | POA: Insufficient documentation

## 2015-12-13 LAB — CBC
HCT: 44.9 % (ref 39.0–52.0)
HEMOGLOBIN: 16 g/dL (ref 13.0–17.0)
MCH: 34.9 pg — AB (ref 26.0–34.0)
MCHC: 35.6 g/dL (ref 30.0–36.0)
MCV: 97.8 fL (ref 78.0–100.0)
Platelets: 210 10*3/uL (ref 150–400)
RBC: 4.59 MIL/uL (ref 4.22–5.81)
RDW: 12.3 % (ref 11.5–15.5)
WBC: 7.5 10*3/uL (ref 4.0–10.5)

## 2015-12-13 LAB — LIPID PANEL
CHOLESTEROL: 138 mg/dL (ref 0–200)
HDL: 35 mg/dL — AB (ref >40)
LDL Cholesterol: 35 mg/dL (ref 0–99)
Total CHOL/HDL Ratio: 3.9 RATIO
Triglycerides: 342 mg/dL — ABNORMAL HIGH (ref <150)
VLDL: 68 mg/dL — AB (ref 0–40)

## 2015-12-13 LAB — COMPREHENSIVE METABOLIC PANEL
ALBUMIN: 3.6 g/dL (ref 3.5–5.0)
ALT: 69 U/L — ABNORMAL HIGH (ref 17–63)
ANION GAP: 13 (ref 5–15)
AST: 115 U/L — ABNORMAL HIGH (ref 15–41)
Alkaline Phosphatase: 132 U/L — ABNORMAL HIGH (ref 38–126)
BILIRUBIN TOTAL: 1 mg/dL (ref 0.3–1.2)
BUN: 11 mg/dL (ref 6–20)
CO2: 28 mmol/L (ref 22–32)
Calcium: 9.4 mg/dL (ref 8.9–10.3)
Chloride: 99 mmol/L — ABNORMAL LOW (ref 101–111)
Creatinine, Ser: 0.78 mg/dL (ref 0.61–1.24)
GFR calc non Af Amer: >60 mL/min (ref >60)
GLUCOSE: 108 mg/dL — AB (ref 65–99)
POTASSIUM: 3.5 mmol/L (ref 3.5–5.1)
Sodium: 140 mmol/L (ref 135–145)
TOTAL PROTEIN: 7 g/dL (ref 6.5–8.1)

## 2015-12-31 ENCOUNTER — Other Ambulatory Visit: Payer: Self-pay | Admitting: Family Medicine

## 2015-12-31 ENCOUNTER — Encounter: Payer: Self-pay | Admitting: Family Medicine

## 2015-12-31 MED ORDER — CLOBETASOL PROPIONATE 0.05 % EX CREA: 1.0000 "application " | TOPICAL_CREAM | Freq: Two times a day (BID) | CUTANEOUS | Status: DC

## 2016-01-02 LAB — AFB CULTURE WITH SMEAR (NOT AT ARMC): Acid Fast Smear: NONE SEEN

## 2016-01-04 ENCOUNTER — Encounter: Payer: Self-pay | Admitting: Family Medicine

## 2016-01-04 ENCOUNTER — Ambulatory Visit (INDEPENDENT_AMBULATORY_CARE_PROVIDER_SITE_OTHER): Payer: 59 | Admitting: Family Medicine

## 2016-01-04 VITALS — BP 126/86 | HR 87 | Wt 223.0 lb

## 2016-01-04 DIAGNOSIS — Z86010 Personal history of colonic polyps: Secondary | ICD-10-CM | POA: Diagnosis not present

## 2016-01-04 DIAGNOSIS — L309 Dermatitis, unspecified: Secondary | ICD-10-CM | POA: Diagnosis not present

## 2016-01-04 DIAGNOSIS — Z0000 Encounter for general adult medical examination without abnormal findings: Secondary | ICD-10-CM

## 2016-01-04 DIAGNOSIS — I10 Essential (primary) hypertension: Secondary | ICD-10-CM

## 2016-01-04 DIAGNOSIS — R7309 Other abnormal glucose: Secondary | ICD-10-CM | POA: Diagnosis not present

## 2016-01-04 DIAGNOSIS — R739 Hyperglycemia, unspecified: Secondary | ICD-10-CM

## 2016-01-04 DIAGNOSIS — M1990 Unspecified osteoarthritis, unspecified site: Secondary | ICD-10-CM | POA: Diagnosis not present

## 2016-01-04 DIAGNOSIS — E669 Obesity, unspecified: Secondary | ICD-10-CM

## 2016-01-04 MED ORDER — LISINOPRIL 10 MG PO TABS: 10.0000 mg | ORAL_TABLET | Freq: Every day | ORAL | Status: DC

## 2016-01-04 MED ORDER — CLOBETASOL PROPIONATE 0.05 % EX CREA: 1.0000 "application " | TOPICAL_CREAM | Freq: Two times a day (BID) | CUTANEOUS | Status: DC

## 2016-01-04 MED ORDER — ATENOLOL-CHLORTHALIDONE 100-25 MG PO TABS: 1.0000 | ORAL_TABLET | Freq: Every day | ORAL | Status: DC

## 2016-01-04 NOTE — Progress Notes (Signed)
   Subjective:    Patient ID: Christopher Craig, male    DOB: 1961/05/06, 55 y.o.   MRN: [id number]  HPI He is here for complete examination. He recently had right trigger finger surgery and is recovering quite nicely from that. He also has evidence of eczema and presently is using clobetasol. He has just started using again approximately week ago. He has hypertension and is doing well on his present medication combination. He continues to make an effort to work on his weight. His arthritis is giving him very little difficulty. He has a history of colonic polyps and has had recent colonoscopy. His had no difficulty with allergy symptoms, pain, GI issues. Family and social history as well as health maintenance and immunizations were reviewed. His work is going well. He is dating someone which seems to be going fairly well.   Review of Systems  All other systems reviewed and are negative.      Objective:   Physical Exam BP 126/86 mmHg  Pulse 87  Wt 223 lb (101.152 kg)  SpO2 97%  General Appearance:    Alert, cooperative, no distress, appears stated age  Head:    Normocephalic, without obvious abnormality, atraumatic  Eyes:    PERRL, conjunctiva/corneas clear, EOM's intact, fundi    benign  Ears:    Normal TM's and external ear canals  Nose:   Nares normal, mucosa normal, no drainage or sinus   tenderness  Throat:   Lips, mucosa, and tongue normal; teeth and gums normal  Neck:   Supple, no lymphadenopathy;  thyroid:  no   enlargement/tenderness/nodules; no carotid   bruit or JVD  Back:    Spine nontender, no curvature, ROM normal, no CVA     tenderness  Lungs:     Clear to auscultation bilaterally without wheezes, rales or     ronchi; respirations unlabored  Chest Wall:    No tenderness or deformity   Heart:    Regular rate and rhythm, S1 and S2 normal, no murmur, rub   or gallop  Breast Exam:    No chest wall tenderness, masses or gynecomastia  Abdomen:     Soft, non-tender,  nondistended, normoactive bowel sounds,    no masses, no hepatosplenomegaly        Extremities:   No clubbing, cyanosis or edema  Pulses:   2+ and symmetric all extremities  Skin:   Skin color, texture, turgor normal, no rashes or lesions  Lymph nodes:   Cervical, supraclavicular, and axillary nodes normal  Neurologic:   CNII-XII intact, normal strength, sensation and gait; reflexes 2+ and symmetric throughout          Psych:   Normal mood, affect, hygiene and grooming.    Recent  blood work was reviewed.Blood sugar was elevated however hemoglobin A1c is 5.4      Assessment & Plan:  Routine general medical examination at a health care facility  Eczema - Plan: clobetasol cream (TEMOVATE) 0.05 %  Arthritis  Essential hypertension - Plan: atenolol-chlorthalidone (TENORETIC) 100-25 MG tablet, lisinopril (PRINIVIL,ZESTRIL) 10 MG tablet  Obesity (BMI 30-39.9)  History of colonic polyps I again encouraged him to make diet and exercise changes to help with his weight. Apparently his father does have diabetes.

## 2016-03-06 ENCOUNTER — Encounter: Payer: Self-pay | Admitting: Family Medicine

## 2016-06-06 ENCOUNTER — Encounter: Payer: Self-pay | Admitting: Family Medicine

## 2016-06-06 ENCOUNTER — Ambulatory Visit (INDEPENDENT_AMBULATORY_CARE_PROVIDER_SITE_OTHER): Payer: 59 | Admitting: Family Medicine

## 2016-06-06 VITALS — BP 130/80 | HR 70 | Wt 224.0 lb

## 2016-06-06 DIAGNOSIS — L309 Dermatitis, unspecified: Secondary | ICD-10-CM | POA: Diagnosis not present

## 2016-06-06 NOTE — Progress Notes (Signed)
   Subjective:    Patient ID: Christopher Craig, male    DOB: 02-21-1961, 55 y.o.   MRN: [id number]  HPI He is here for consult concerning difficulty with a rash in the inner thigh bilaterally. He states that it is causing more difficulty.   Review of Systems     Objective:   Physical Exam Exam of the skin in the thigh area does show some chronic skin changes as well as hyperpigmentation.       Assessment & Plan:  Dermatitis I explained that this is probably from chronic chafing. Recommend compression shorts to create a barrier there he continues have difficulty, I will refer to dermatology.

## 2016-07-11 DIAGNOSIS — H5203 Hypermetropia, bilateral: Secondary | ICD-10-CM | POA: Diagnosis not present

## 2016-09-22 ENCOUNTER — Encounter: Payer: Self-pay | Admitting: Family Medicine

## 2016-09-22 MED ORDER — TADALAFIL 20 MG PO TABS: 20.0000 mg | ORAL_TABLET | Freq: Every day | ORAL | 5 refills | Status: DC | PRN

## 2016-12-20 ENCOUNTER — Other Ambulatory Visit: Payer: Self-pay | Admitting: Family Medicine

## 2016-12-20 DIAGNOSIS — L309 Dermatitis, unspecified: Secondary | ICD-10-CM

## 2017-01-09 ENCOUNTER — Encounter: Payer: Self-pay | Admitting: Family Medicine

## 2017-01-09 MED ORDER — SILDENAFIL CITRATE 100 MG PO TABS: 100.0000 mg | ORAL_TABLET | Freq: Every day | ORAL | 11 refills | Status: DC | PRN

## 2017-01-10 ENCOUNTER — Telehealth: Payer: Self-pay | Admitting: Family Medicine

## 2017-01-10 MED ORDER — SILDENAFIL CITRATE 100 MG PO TABS: 100.0000 mg | ORAL_TABLET | Freq: Every day | ORAL | 11 refills | Status: DC | PRN

## 2017-01-10 NOTE — Addendum Note (Signed)
Addended by: Denita Lung on: 01/10/2017 03:40 PM   Modules accepted: Orders

## 2017-01-10 NOTE — Telephone Encounter (Signed)
Pt called his Viagra was sent to wrong pharmacy,  I cancelled at [place] called into [facility] pharmacy

## 2017-01-18 ENCOUNTER — Telehealth: Payer: Self-pay | Admitting: Family Medicine

## 2017-01-18 DIAGNOSIS — Z0000 Encounter for general adult medical examination without abnormal findings: Secondary | ICD-10-CM

## 2017-01-18 DIAGNOSIS — I10 Essential (primary) hypertension: Secondary | ICD-10-CM

## 2017-01-18 DIAGNOSIS — E669 Obesity, unspecified: Secondary | ICD-10-CM

## 2017-01-18 NOTE — Telephone Encounter (Signed)
Pt informed and verbalized understanding

## 2017-01-18 NOTE — Telephone Encounter (Signed)
Pt called and made his cpe for march the 21st but wants to go ahead a have his fasting labs put in epic so he have them done at [facility], if that's ok, please call him at [phone] after the orders are in so he can have them done

## 2017-01-18 NOTE — Telephone Encounter (Signed)
Done

## 2017-01-19 ENCOUNTER — Telehealth: Payer: Self-pay | Admitting: Family Medicine

## 2017-01-19 NOTE — Telephone Encounter (Signed)
Faxed rx for labs to pt he has been informed

## 2017-01-19 NOTE — Telephone Encounter (Signed)
Take care of this 

## 2017-01-19 NOTE — Telephone Encounter (Signed)
Pt called and stated that lab orders are in epic but can not be released by [facility]. He would like paper rx orders faxed to his work at [phone]. Also please call pt when sending so he can go to fax machine and pick up. Pt number is [phone].

## 2017-01-30 ENCOUNTER — Other Ambulatory Visit (HOSPITAL_COMMUNITY)
Admission: RE | Admit: 2017-01-30 | Discharge: 2017-01-30 | Disposition: A | Discharge status: Home / Self Care | Payer: 59 | Source: Ambulatory Visit | Attending: Family Medicine | Admitting: Family Medicine

## 2017-01-30 DIAGNOSIS — I10 Essential (primary) hypertension: Secondary | ICD-10-CM | POA: Insufficient documentation

## 2017-01-30 LAB — LIPID PANEL
CHOL/HDL RATIO: 3.2 ratio
CHOLESTEROL: 158 mg/dL (ref 0–200)
HDL: 50 mg/dL (ref >40)
LDL CALC: 70 mg/dL (ref 0–99)
TRIGLYCERIDES: 192 mg/dL — AB (ref <150)
VLDL: 38 mg/dL (ref 0–40)

## 2017-01-30 LAB — COMPREHENSIVE METABOLIC PANEL
ALT: 58 U/L (ref 17–63)
AST: 74 U/L — AB (ref 15–41)
Albumin: 4.1 g/dL (ref 3.5–5.0)
Alkaline Phosphatase: 83 U/L (ref 38–126)
Anion gap: 12 (ref 5–15)
BUN: 10 mg/dL (ref 6–20)
CO2: 28 mmol/L (ref 22–32)
CREATININE: 0.99 mg/dL (ref 0.61–1.24)
Calcium: 9.7 mg/dL (ref 8.9–10.3)
Chloride: 98 mmol/L — ABNORMAL LOW (ref 101–111)
GFR calc non Af Amer: >60 mL/min (ref >60)
Glucose, Bld: 111 mg/dL — ABNORMAL HIGH (ref 65–99)
POTASSIUM: 3.2 mmol/L — AB (ref 3.5–5.1)
SODIUM: 138 mmol/L (ref 135–145)
Total Bilirubin: 1.6 mg/dL — ABNORMAL HIGH (ref 0.3–1.2)
Total Protein: 7.8 g/dL (ref 6.5–8.1)

## 2017-01-30 LAB — CBC
HCT: 47.6 % (ref 39.0–52.0)
Hemoglobin: 17 g/dL (ref 13.0–17.0)
MCH: 34.7 pg — AB (ref 26.0–34.0)
MCHC: 35.7 g/dL (ref 30.0–36.0)
MCV: 97.1 fL (ref 78.0–100.0)
PLATELETS: 170 10*3/uL (ref 150–400)
RBC: 4.9 MIL/uL (ref 4.22–5.81)
RDW: 12 % (ref 11.5–15.5)
WBC: 6.9 10*3/uL (ref 4.0–10.5)

## 2017-01-30 LAB — PSA: PSA: 1.43 ng/mL (ref 0.00–4.00)

## 2017-02-02 ENCOUNTER — Other Ambulatory Visit: Payer: Self-pay | Admitting: Family Medicine

## 2017-02-02 DIAGNOSIS — I10 Essential (primary) hypertension: Secondary | ICD-10-CM

## 2017-02-05 ENCOUNTER — Other Ambulatory Visit: Payer: Self-pay | Admitting: Family Medicine

## 2017-02-05 DIAGNOSIS — L309 Dermatitis, unspecified: Secondary | ICD-10-CM

## 2017-02-07 ENCOUNTER — Ambulatory Visit (INDEPENDENT_AMBULATORY_CARE_PROVIDER_SITE_OTHER): Payer: 59 | Admitting: Family Medicine

## 2017-02-07 ENCOUNTER — Encounter: Payer: Self-pay | Admitting: Family Medicine

## 2017-02-07 VITALS — BP 130/80 | HR 75 | Ht 68.0 in | Wt 217.0 lb

## 2017-02-07 DIAGNOSIS — M1990 Unspecified osteoarthritis, unspecified site: Secondary | ICD-10-CM | POA: Diagnosis not present

## 2017-02-07 DIAGNOSIS — Z0000 Encounter for general adult medical examination without abnormal findings: Secondary | ICD-10-CM

## 2017-02-07 DIAGNOSIS — L309 Dermatitis, unspecified: Secondary | ICD-10-CM

## 2017-02-07 DIAGNOSIS — E669 Obesity, unspecified: Secondary | ICD-10-CM

## 2017-02-07 DIAGNOSIS — I10 Essential (primary) hypertension: Secondary | ICD-10-CM | POA: Diagnosis not present

## 2017-02-07 DIAGNOSIS — Z23 Encounter for immunization: Secondary | ICD-10-CM | POA: Diagnosis not present

## 2017-02-07 DIAGNOSIS — R739 Hyperglycemia, unspecified: Secondary | ICD-10-CM | POA: Diagnosis not present

## 2017-02-07 LAB — POCT GLYCOSYLATED HEMOGLOBIN (HGB A1C): Hemoglobin A1C: 5.3

## 2017-02-07 MED ORDER — LISINOPRIL 10 MG PO TABS: 10.0000 mg | ORAL_TABLET | Freq: Every day | ORAL | 3 refills | Status: DC

## 2017-02-07 MED ORDER — ATENOLOL-CHLORTHALIDONE 100-25 MG PO TABS: 1.0000 | ORAL_TABLET | Freq: Every day | ORAL | 3 refills | Status: DC

## 2017-02-07 NOTE — Progress Notes (Signed)
   Subjective:    Patient ID: Christopher Craig, male    DOB: 05-01-1961, 56 y.o.   MRN: [id number]  HPI  he is here for complete examination. He continues on his lisinopril and is having no difficulty  His arthritis gets severe little difficulty in naming had trouble with his hands and did have surgery on that. His weight is down slightly. His have underlying eczema but is having very little difficulty with that. His work is stressful. He does have a part-time job with [employer]. He has no underlying allergies. Does not smoke. No complaint of chest pain, . Presently he is not dating anyone.   Review of Systems  All other systems reviewed and are negative.      Objective:   Physical Exam BP 130/80   Pulse 75   Ht 5\' 8"  (1.727 m)   Wt 217 lb (98.4 kg)   SpO2 98%   BMI 32.99 kg/m   General Appearance:    Alert, cooperative, no distress, appears stated age  Head:    Normocephalic, without obvious abnormality, atraumatic  Eyes:    PERRL, conjunctiva/corneas clear, EOM's intact, fundi    benign  Ears:    Normal TM's and external ear canals  Nose:   Nares normal, mucosa normal, no drainage or sinus   tenderness  Throat:   Lips, mucosa, and tongue normal; teeth and gums normal  Neck:   Supple, no lymphadenopathy;  thyroid:  no   enlargement/tenderness/nodules; no carotid   bruit or JVD     Lungs:     Clear to auscultation bilaterally without wheezes, rales or     ronchi; respirations unlabored      Heart:    Regular rate and rhythm, S1 and S2 normal, no murmur, rub   or gallop     Abdomen:     Soft, non-tender, nondistended, normoactive bowel sounds,    no masses, no hepatosplenomegaly  Genitalia:   Deferred  Rectal:   deferred  Extremities:   No clubbing, cyanosis or edema  Pulses:   2+ and symmetric all extremities  Skin:   Skin color, texture, turgor normal, no rashes. Several contusions are noted from recent softball game.  Lymph nodes:   Cervical, supraclavicular, and  axillary nodes normal  Neurologic:   CNII-XII intact, normal strength, sensation and gait; reflexes 2+ and symmetric throughout          Psych:   Normal mood, affect, hygiene and grooming.    Hemoglobin A1c due to elevated blood sugar 5.3       Assessment & Plan:  Routine general medical examination at a health care facility  Need for Tdap vaccination - Plan: Tdap vaccine greater than or equal to 7yo IM  Essential hypertension - Plan: lisinopril (PRINIVIL,ZESTRIL) 10 MG tablet, atenolol-chlorthalidone (TENORETIC) 100-25 MG tablet  Arthritis  Elevated blood sugar - Plan: HgB A1c  Obesity (BMI 30-39.9) - Plan: HgB A1c   He is doing well on his present medications and I will therefore continue him on that. His immunizations were updated.Encouraged him to continue with his weight loss.

## 2017-08-01 ENCOUNTER — Encounter: Payer: Self-pay | Admitting: Family Medicine

## 2017-08-08 ENCOUNTER — Ambulatory Visit (INDEPENDENT_AMBULATORY_CARE_PROVIDER_SITE_OTHER): Payer: 59 | Admitting: Podiatry

## 2017-08-08 ENCOUNTER — Ambulatory Visit (INDEPENDENT_AMBULATORY_CARE_PROVIDER_SITE_OTHER): Payer: 59

## 2017-08-08 ENCOUNTER — Encounter: Payer: Self-pay | Admitting: Podiatry

## 2017-08-08 VITALS — BP 155/101 | HR 89 | Resp 16

## 2017-08-08 DIAGNOSIS — M205X2 Other deformities of toe(s) (acquired), left foot: Secondary | ICD-10-CM

## 2017-08-08 DIAGNOSIS — M2040 Other hammer toe(s) (acquired), unspecified foot: Secondary | ICD-10-CM | POA: Diagnosis not present

## 2017-08-08 DIAGNOSIS — M205X1 Other deformities of toe(s) (acquired), right foot: Secondary | ICD-10-CM

## 2017-08-08 NOTE — Progress Notes (Signed)
   Subjective:    Patient ID: Christopher Craig, male    DOB: October 20, 1961, 56 y.o.   MRN: [id number]  HPI    Review of Systems  All other systems reviewed and are negative.      Objective:   Physical Exam        Assessment & Plan:

## 2017-08-08 NOTE — Progress Notes (Signed)
Patient ID: Christopher Craig, male   DOB: 1961/07/22, 56 y.o.   MRN: [id number]   HPI: 57 year old male presents to the office as a new patient for evaluation of painful hammertoes to the bilateral feet. Patient states that he did have surgery to his fifth digits of the bilateral feet approximately 25+ years ago in [place]. Patient works at the [employer] and is on his feet all day long. Notices general foot fatigue with pain by the end of the day. He is also very unsatisfied with the appearance of the deformity and states that he got his mother's feet.  Past Medical History:  Diagnosis Date  . Allergy    RHINITIS  . Hernia, inguinal, left   . Hernia, umbilical   . Hypertension      Physical Exam: General: The patient is alert and oriented x3 in no acute distress.  Dermatology: Skin is warm, dry and supple bilateral lower extremities. Negative for open lesions or macerations.  Vascular: Palpable pedal pulses bilaterally. No edema or erythema noted. Capillary refill within normal limits.  Neurological: Epicritic and protective threshold grossly intact bilaterally.   Musculoskeletal Exam: Hammertoe contracture deformity noted to digits 2-5 in bilateral feet with mallet toes to the bilateral great toes. Range of motion within normal limits to all pedal and ankle joints bilateral. Muscle strength 5/5 in all groups bilateral.   Radiographic Exam:  Normal osseous mineralization. Joint spaces preserved. No fracture/dislocation/boney destruction.  Dorsal dislocation of the MTPJ with flexion of the PIPJ noted to all digits 2-5 bilateral. Flexion contracture also noted to the IPJ of the bilateral great toes.  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 X-rays reviewed today. - Today we discussed conservative versus surgical management of hammertoe and  mallet toe deformities. - At the moment we are going to pursue conservative management. The patient is unable to take an extensive period of time off of work. -  Appointment with Liliane Channel, pedorthist, made today for custom molded orthotics - Follow-up on next few appointments with Liliane Channel for orthotics dispensal and casting  Edrick Kins, DPM [facility]  Dr. Edrick Kins, DPM    [address], [place] [address]                Office [phone]  Fax [phone]

## 2017-08-14 ENCOUNTER — Other Ambulatory Visit: Payer: 59 | Admitting: Orthotics

## 2017-08-14 ENCOUNTER — Encounter: Payer: Self-pay | Admitting: Family Medicine

## 2017-09-04 ENCOUNTER — Ambulatory Visit (INDEPENDENT_AMBULATORY_CARE_PROVIDER_SITE_OTHER): Payer: 59 | Admitting: Orthotics

## 2017-09-04 DIAGNOSIS — M205X1 Other deformities of toe(s) (acquired), right foot: Secondary | ICD-10-CM | POA: Diagnosis not present

## 2017-09-04 DIAGNOSIS — M2040 Other hammer toe(s) (acquired), unspecified foot: Secondary | ICD-10-CM

## 2017-09-04 DIAGNOSIS — M205X2 Other deformities of toe(s) (acquired), left foot: Secondary | ICD-10-CM

## 2017-09-04 NOTE — Progress Notes (Signed)
Patient came in today to pick up custom made foot orthotics.  The goals were accomplished and the patient reported no dissatisfaction with said orthotics.  Patient was advised of breakin period and how to report any issues. 

## 2017-11-28 DIAGNOSIS — H524 Presbyopia: Secondary | ICD-10-CM | POA: Diagnosis not present

## 2017-11-28 DIAGNOSIS — H5203 Hypermetropia, bilateral: Secondary | ICD-10-CM | POA: Diagnosis not present

## 2018-01-08 DIAGNOSIS — M25512 Pain in left shoulder: Secondary | ICD-10-CM | POA: Diagnosis not present

## 2018-01-17 DIAGNOSIS — M25512 Pain in left shoulder: Secondary | ICD-10-CM | POA: Diagnosis not present

## 2018-01-24 DIAGNOSIS — M25512 Pain in left shoulder: Secondary | ICD-10-CM | POA: Diagnosis not present

## 2018-02-28 ENCOUNTER — Encounter: Payer: Self-pay | Admitting: Family Medicine

## 2018-02-28 ENCOUNTER — Ambulatory Visit (INDEPENDENT_AMBULATORY_CARE_PROVIDER_SITE_OTHER): Payer: 59 | Admitting: Family Medicine

## 2018-02-28 VITALS — BP 138/96 | HR 71 | Temp 98.4°F | Ht 67.0 in | Wt 236.8 lb

## 2018-02-28 DIAGNOSIS — Z0000 Encounter for general adult medical examination without abnormal findings: Secondary | ICD-10-CM

## 2018-02-28 DIAGNOSIS — E669 Obesity, unspecified: Secondary | ICD-10-CM

## 2018-02-28 DIAGNOSIS — R739 Hyperglycemia, unspecified: Secondary | ICD-10-CM | POA: Diagnosis not present

## 2018-02-28 DIAGNOSIS — M1990 Unspecified osteoarthritis, unspecified site: Secondary | ICD-10-CM | POA: Diagnosis not present

## 2018-02-28 DIAGNOSIS — I10 Essential (primary) hypertension: Secondary | ICD-10-CM

## 2018-02-28 DIAGNOSIS — D126 Benign neoplasm of colon, unspecified: Secondary | ICD-10-CM

## 2018-02-28 DIAGNOSIS — Z125 Encounter for screening for malignant neoplasm of prostate: Secondary | ICD-10-CM | POA: Diagnosis not present

## 2018-02-28 DIAGNOSIS — N529 Male erectile dysfunction, unspecified: Secondary | ICD-10-CM | POA: Diagnosis not present
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
## 2018-03-01 LAB — COMPREHENSIVE METABOLIC PANEL
A/G RATIO: 1.3 (ref 1.2–2.2)
ALBUMIN: 3.9 g/dL (ref 3.5–5.5)
ALT: 46 IU/L — ABNORMAL HIGH (ref 0–44)
AST: 96 IU/L — ABNORMAL HIGH (ref 0–40)
Alkaline Phosphatase: 184 IU/L — ABNORMAL HIGH (ref 39–117)
BILIRUBIN TOTAL: 1.1 mg/dL (ref 0.0–1.2)
BUN / CREAT RATIO: 10 (ref 9–20)
BUN: 9 mg/dL (ref 6–24)
CALCIUM: 9.5 mg/dL (ref 8.7–10.2)
CHLORIDE: 95 mmol/L — AB (ref 96–106)
CO2: 27 mmol/L (ref 20–29)
Creatinine, Ser: 0.87 mg/dL (ref 0.76–1.27)
GFR, EST AFRICAN AMERICAN: 111 mL/min/{1.73_m2} (ref >59)
GFR, EST NON AFRICAN AMERICAN: 96 mL/min/{1.73_m2} (ref >59)
GLOBULIN, TOTAL: 3.1 g/dL (ref 1.5–4.5)
Glucose: 115 mg/dL — ABNORMAL HIGH (ref 65–99)
POTASSIUM: 3.4 mmol/L — AB (ref 3.5–5.2)
SODIUM: 139 mmol/L (ref 134–144)
TOTAL PROTEIN: 7 g/dL (ref 6.0–8.5)

## 2018-03-01 LAB — CBC WITH DIFFERENTIAL/PLATELET
BASOS: 0 %
Basophils Absolute: 0 10*3/uL (ref 0.0–0.2)
EOS (ABSOLUTE): 0.1 10*3/uL (ref 0.0–0.4)
EOS: 1 %
HEMATOCRIT: 48.5 % (ref 37.5–51.0)
Hemoglobin: 17.2 g/dL (ref 13.0–17.7)
IMMATURE GRANS (ABS): 0 10*3/uL (ref 0.0–0.1)
IMMATURE GRANULOCYTES: 0 %
LYMPHS: 27 %
Lymphocytes Absolute: 2.6 10*3/uL (ref 0.7–3.1)
MCH: 35.9 pg — AB (ref 26.6–33.0)
MCHC: 35.5 g/dL (ref 31.5–35.7)
MCV: 101 fL — AB (ref 79–97)
Monocytes Absolute: 1.1 10*3/uL — ABNORMAL HIGH (ref 0.1–0.9)
Monocytes: 11 %
NEUTROS ABS: 5.7 10*3/uL (ref 1.4–7.0)
NEUTROS PCT: 61 %
PLATELETS: 215 10*3/uL (ref 150–379)
RBC: 4.79 x10E6/uL (ref 4.14–5.80)
RDW: 13.3 % (ref 12.3–15.4)
WBC: 9.5 10*3/uL (ref 3.4–10.8)

## 2018-03-01 LAB — LIPID PANEL
Chol/HDL Ratio: 3.5 ratio (ref 0.0–5.0)
Cholesterol, Total: 119 mg/dL (ref 100–199)
HDL: 34 mg/dL — AB (ref >39)
LDL Calculated: 44 mg/dL (ref 0–99)
Triglycerides: 206 mg/dL — ABNORMAL HIGH (ref 0–149)
VLDL Cholesterol Cal: 41 mg/dL — ABNORMAL HIGH (ref 5–40)

## 2018-03-01 LAB — PSA: Prostate Specific Ag, Serum: 1 ng/mL (ref 0.0–4.0)

## 2018-03-11 ENCOUNTER — Encounter: Payer: 59 | Admitting: Family Medicine

## 2018-03-15 ENCOUNTER — Encounter: Payer: Self-pay | Admitting: Family Medicine

## 2018-04-11 ENCOUNTER — Encounter: Payer: Self-pay | Admitting: Family Medicine

## 2018-04-11 MED ORDER — TADALAFIL 20 MG PO TABS: 20.0000 mg | ORAL_TABLET | Freq: Every day | ORAL | 0 refills | Status: DC | PRN

## 2018-05-18 ENCOUNTER — Telehealth: Payer: Self-pay | Admitting: Family Medicine

## 2018-05-18 NOTE — Telephone Encounter (Signed)
P.A. TADALAFIL 

## 2018-05-28 ENCOUNTER — Encounter: Payer: Self-pay | Admitting: Family Medicine

## 2018-05-28 ENCOUNTER — Other Ambulatory Visit: Payer: Self-pay

## 2018-05-28 DIAGNOSIS — I10 Essential (primary) hypertension: Secondary | ICD-10-CM

## 2018-05-28 MED ORDER — LISINOPRIL 10 MG PO TABS: 10.0000 mg | ORAL_TABLET | Freq: Every day | ORAL | 3 refills | Status: DC

## 2018-05-28 MED ORDER — ATENOLOL-CHLORTHALIDONE 100-25 MG PO TABS: 1.0000 | ORAL_TABLET | Freq: Every day | ORAL | 3 refills | Status: DC

## 2018-05-31 NOTE — Telephone Encounter (Signed)
P.A. Approved for 6 tablets per 30 days, left message for pt

## 2018-06-24 ENCOUNTER — Encounter: Payer: Self-pay | Admitting: Family Medicine

## 2018-06-24 MED ORDER — TRIAMCINOLONE ACETONIDE 0.5 % EX CREA: 1.0000 "application " | TOPICAL_CREAM | Freq: Three times a day (TID) | CUTANEOUS | 5 refills | Status: DC

## 2018-07-02 ENCOUNTER — Encounter: Payer: Self-pay | Admitting: Family Medicine

## 2018-07-02 MED ORDER — TRIAMCINOLONE ACETONIDE 0.5 % EX CREA: 1.0000 "application " | TOPICAL_CREAM | Freq: Two times a day (BID) | CUTANEOUS | 5 refills | Status: DC | PRN

## 2018-08-06 ENCOUNTER — Encounter: Payer: Self-pay | Admitting: Family Medicine

## 2018-08-06 MED ORDER — TADALAFIL 20 MG PO TABS: 20.0000 mg | ORAL_TABLET | Freq: Every day | ORAL | 5 refills | Status: DC | PRN

## 2018-08-13 ENCOUNTER — Encounter: Payer: Self-pay | Admitting: Family Medicine

## 2018-09-26 ENCOUNTER — Ambulatory Visit
Admission: RE | Admit: 2018-09-26 | Discharge: 2018-09-26 | Disposition: A | Discharge status: Home / Self Care | Payer: 59 | Source: Ambulatory Visit | Attending: Family Medicine | Admitting: Family Medicine

## 2018-09-26 ENCOUNTER — Encounter: Payer: Self-pay | Admitting: Family Medicine

## 2018-09-26 ENCOUNTER — Ambulatory Visit (INDEPENDENT_AMBULATORY_CARE_PROVIDER_SITE_OTHER): Payer: 59 | Admitting: Family Medicine

## 2018-09-26 VITALS — BP 160/94 | HR 85 | Temp 99.6°F | Wt 246.2 lb

## 2018-09-26 DIAGNOSIS — R531 Weakness: Secondary | ICD-10-CM

## 2018-09-26 DIAGNOSIS — R0682 Tachypnea, not elsewhere classified: Secondary | ICD-10-CM | POA: Diagnosis not present

## 2018-09-26 DIAGNOSIS — F1010 Alcohol abuse, uncomplicated: Secondary | ICD-10-CM | POA: Diagnosis not present

## 2018-09-26 DIAGNOSIS — R4781 Slurred speech: Secondary | ICD-10-CM

## 2018-09-26 DIAGNOSIS — R918 Other nonspecific abnormal finding of lung field: Secondary | ICD-10-CM | POA: Diagnosis not present

## 2018-09-26 NOTE — Progress Notes (Signed)
   Subjective:    Patient ID: Christopher Craig, male    DOB: Jan 15, 1961, 57 y.o.   MRN: [id number]  HPI He complains of a one-week history of weakness, feeling sluggish, wheezing, chest congestion with dry cough, slurred speech as well as tremor mainly in the right hand.  Although he does state he is shaking all over.  No fever, chills, abdominal pain, vomiting.  Bowel habits have been normal.  He does not use street drugs.  He admits to drinking at least 1/5 of alcohol daily.  Says that he could drinking about a week ago.   Review of Systems     Objective:   Physical Exam Alert in sluggish appearing with slurred speech.  EOMI however the sclera are injected, jaundice not appreciated.Marland Kitchen  DTRs are diminished.  Normal finger-to-nose.  No tremor noted.  Cerebellar testing showed unsteadiness but he did not fall to one side or another.  No asterixis noted.  TMs clear.  Throat is clear.  Neck is supple without adenopathy and cardiac exam shows regular rhythm without murmurs or gallops.  Lungs are clear to auscultation but tachypnea is noted.  Abdominal exam difficult to do due to large abdomen.  Decreased bowel sounds.  No fluid wave was noted.       Assessment & Plan:  Alcohol abuse - Plan: CBC with Differential/Platelet, Comprehensive metabolic panel  Weakness - Plan: CBC with Differential/Platelet, Comprehensive metabolic panel  Slurred speech - Plan: CBC with Differential/Platelet, Comprehensive metabolic panel  Tachypnea - Plan: DG Chest 2 View Alcohol abuse with possible liver failure are certainly a high likelihood.  We will await blood work and move forward based on that.

## 2018-09-27 ENCOUNTER — Other Ambulatory Visit: Payer: Self-pay | Admitting: Medical

## 2018-09-27 ENCOUNTER — Encounter (HOSPITAL_COMMUNITY): Payer: Self-pay | Admitting: Emergency Medicine

## 2018-09-27 ENCOUNTER — Encounter: Payer: Self-pay | Admitting: Family Medicine

## 2018-09-27 ENCOUNTER — Telehealth: Payer: Self-pay | Admitting: Medical

## 2018-09-27 ENCOUNTER — Emergency Department (HOSPITAL_COMMUNITY)
Admission: EM | Admit: 2018-09-27 | Discharge: 2018-09-27 | Disposition: A | Discharge status: Home / Self Care | Payer: 59 | Attending: Emergency Medicine | Admitting: Emergency Medicine

## 2018-09-27 ENCOUNTER — Ambulatory Visit
Admission: RE | Admit: 2018-09-27 | Discharge: 2018-09-27 | Disposition: A | Discharge status: Home / Self Care | Payer: 59 | Source: Ambulatory Visit | Attending: Medical | Admitting: Medical

## 2018-09-27 DIAGNOSIS — K7011 Alcoholic hepatitis with ascites: Secondary | ICD-10-CM | POA: Diagnosis not present

## 2018-09-27 DIAGNOSIS — Z79899 Other long term (current) drug therapy: Secondary | ICD-10-CM | POA: Diagnosis not present

## 2018-09-27 DIAGNOSIS — F1010 Alcohol abuse, uncomplicated: Secondary | ICD-10-CM | POA: Diagnosis not present

## 2018-09-27 DIAGNOSIS — K7030 Alcoholic cirrhosis of liver without ascites: Secondary | ICD-10-CM | POA: Diagnosis not present

## 2018-09-27 DIAGNOSIS — E876 Hypokalemia: Secondary | ICD-10-CM | POA: Diagnosis not present

## 2018-09-27 DIAGNOSIS — R935 Abnormal findings on diagnostic imaging of other abdominal regions, including retroperitoneum: Secondary | ICD-10-CM | POA: Diagnosis present

## 2018-09-27 DIAGNOSIS — K852 Alcohol induced acute pancreatitis without necrosis or infection: Secondary | ICD-10-CM

## 2018-09-27 DIAGNOSIS — I10 Essential (primary) hypertension: Secondary | ICD-10-CM | POA: Diagnosis not present

## 2018-09-27 DIAGNOSIS — R748 Abnormal levels of other serum enzymes: Secondary | ICD-10-CM

## 2018-09-27 LAB — COMPREHENSIVE METABOLIC PANEL
A/G RATIO: 0.6 — AB (ref 1.2–2.2)
ALK PHOS: 231 IU/L — AB (ref 39–117)
ALT: 52 IU/L — ABNORMAL HIGH (ref 0–44)
ALT: 56 U/L — ABNORMAL HIGH (ref 0–44)
AST: 192 IU/L — AB (ref 0–40)
AST: 175 U/L — AB (ref 15–41)
Albumin: 2.6 g/dL — ABNORMAL LOW (ref 3.5–5.5)
Albumin: 2.6 g/dL — ABNORMAL LOW (ref 3.5–5.0)
Alkaline Phosphatase: 207 U/L — ABNORMAL HIGH (ref 38–126)
Anion gap: 15 (ref 5–15)
BILIRUBIN TOTAL: 4.9 mg/dL — AB (ref 0.0–1.2)
BUN/Creatinine Ratio: 10 (ref 9–20)
BUN: 7 mg/dL (ref 6–24)
BUN: 7 mg/dL (ref 6–20)
CHLORIDE: 93 mmol/L — AB (ref 98–111)
CO2: 25 mmol/L (ref 20–29)
CO2: 25 mmol/L (ref 22–32)
Calcium: 8 mg/dL — ABNORMAL LOW (ref 8.7–10.2)
Calcium: 8 mg/dL — ABNORMAL LOW (ref 8.9–10.3)
Chloride: 92 mmol/L — ABNORMAL LOW (ref 96–106)
Creatinine, Ser: 0.73 mg/dL — ABNORMAL LOW (ref 0.76–1.27)
Creatinine, Ser: 0.78 mg/dL (ref 0.61–1.24)
GFR calc Af Amer: 119 mL/min/{1.73_m2} (ref >59)
GFR calc non Af Amer: 103 mL/min/{1.73_m2} (ref >59)
Globulin, Total: 4.3 g/dL (ref 1.5–4.5)
Glucose, Bld: 104 mg/dL — ABNORMAL HIGH (ref 70–99)
Glucose: 102 mg/dL — ABNORMAL HIGH (ref 65–99)
POTASSIUM: 3.2 mmol/L — AB (ref 3.5–5.2)
POTASSIUM: 2.9 mmol/L — AB (ref 3.5–5.1)
SODIUM: 133 mmol/L — AB (ref 135–145)
Sodium: 134 mmol/L (ref 134–144)
Total Bilirubin: 5.3 mg/dL — ABNORMAL HIGH (ref 0.3–1.2)
Total Protein: 6.9 g/dL (ref 6.0–8.5)
Total Protein: 7.5 g/dL (ref 6.5–8.1)

## 2018-09-27 LAB — CBC WITH DIFFERENTIAL/PLATELET
ABS IMMATURE GRANULOCYTES: 0.04 10*3/uL (ref 0.00–0.07)
BASOS: 1 %
Basophils Absolute: 0.1 10*3/uL (ref 0.0–0.2)
Basophils Absolute: 0.1 10*3/uL (ref 0.0–0.1)
Basophils Relative: 1 %
EOS (ABSOLUTE): 0.1 10*3/uL (ref 0.0–0.4)
EOS PCT: 1 %
Eos: 1 %
Eosinophils Absolute: 0.1 10*3/uL (ref 0.0–0.5)
HEMATOCRIT: 38.6 % (ref 37.5–51.0)
HEMATOCRIT: 42.9 % (ref 39.0–52.0)
Hemoglobin: 14.5 g/dL (ref 13.0–17.7)
Hemoglobin: 14.4 g/dL (ref 13.0–17.0)
Immature Grans (Abs): 0 10*3/uL (ref 0.0–0.1)
Immature Granulocytes: 0 %
Immature Granulocytes: 0 %
LYMPHS ABS: 2.3 10*3/uL (ref 0.7–4.0)
LYMPHS PCT: 16 %
Lymphocytes Absolute: 1.7 10*3/uL (ref 0.7–3.1)
Lymphs: 14 %
MCH: 37.7 pg — ABNORMAL HIGH (ref 26.6–33.0)
MCH: 35.4 pg — AB (ref 26.0–34.0)
MCHC: 37.6 g/dL — AB (ref 31.5–35.7)
MCHC: 33.6 g/dL (ref 30.0–36.0)
MCV: 100 fL — AB (ref 79–97)
MCV: 105.4 fL — ABNORMAL HIGH (ref 80.0–100.0)
MONO ABS: 1.3 10*3/uL — AB (ref 0.1–1.0)
MONOS ABS: 1.3 10*3/uL — AB (ref 0.1–0.9)
MONOS PCT: 10 %
Monocytes: 10 %
NEUTROS ABS: 9.1 10*3/uL — AB (ref 1.4–7.0)
NEUTROS PCT: 74 %
Neutro Abs: 10.2 10*3/uL — ABNORMAL HIGH (ref 1.7–7.7)
Neutrophils Relative %: 72 %
Platelets: 114 10*3/uL — ABNORMAL LOW (ref 150–450)
Platelets: 136 10*3/uL — ABNORMAL LOW (ref 150–400)
RBC: 3.85 x10E6/uL — ABNORMAL LOW (ref 4.14–5.80)
RBC: 4.07 MIL/uL — ABNORMAL LOW (ref 4.22–5.81)
RDW: 12.2 % — ABNORMAL LOW (ref 12.3–15.4)
RDW: 13.3 % (ref 11.5–15.5)
WBC: 12.3 10*3/uL — ABNORMAL HIGH (ref 3.4–10.8)
WBC: 14 10*3/uL — ABNORMAL HIGH (ref 4.0–10.5)
nRBC: 0 % (ref 0.0–0.2)

## 2018-09-27 LAB — PROTIME-INR
INR: 1.73
Prothrombin Time: 20 seconds — ABNORMAL HIGH (ref 11.4–15.2)

## 2018-09-27 LAB — ETHANOL: Alcohol, Ethyl (B): <10 mg/dL (ref <10)

## 2018-09-27 LAB — LIPASE, BLOOD: Lipase: 386 U/L — ABNORMAL HIGH (ref 11–51)

## 2018-09-27 MED ORDER — CHLORDIAZEPOXIDE HCL 25 MG PO CAPS: ORAL_CAPSULE | ORAL | 0 refills | Status: DC

## 2018-09-27 MED ORDER — IOPAMIDOL (ISOVUE-300) INJECTION 61%
125.0000 mL | Freq: Once | INTRAVENOUS | Status: AC | PRN
  Administered 2018-09-27: 125 mL via INTRAVENOUS

## 2018-09-27 MED ORDER — POTASSIUM CHLORIDE 10 MEQ/100ML IV SOLN
10.0000 meq | Freq: Once | INTRAVENOUS | Status: AC
  Administered 2018-09-27: 10 meq via INTRAVENOUS
  Filled 2018-09-27: qty 100

## 2018-09-27 MED ORDER — LORAZEPAM 2 MG/ML IJ SOLN
1.0000 mg | Freq: Once | INTRAMUSCULAR | Status: AC
  Administered 2018-09-27: 1 mg via INTRAVENOUS
  Filled 2018-09-27: qty 1

## 2018-09-27 MED ORDER — POTASSIUM CHLORIDE CRYS ER 20 MEQ PO TBCR
40.0000 meq | EXTENDED_RELEASE_TABLET | Freq: Once | ORAL | Status: AC
  Administered 2018-09-27: 40 meq via ORAL
  Filled 2018-09-27: qty 2
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
## 2018-09-27 NOTE — Telephone Encounter (Signed)
Pt was notified that he needs to go to [facility] to have this done. No prior auth needed for CT abdomen pelvis with contrast. Pt does not get off until 3pm but will be there by 3:30pm as a walk in   [place]

## 2018-09-27 NOTE — ED Notes (Signed)
ED Provider at bedside. 

## 2018-09-27 NOTE — ED Notes (Signed)
Reviewed d/c instructions with pt, who verbalized understanding and had no outstanding questions. Pt departed in NAD, refused use of wheelchair.   

## 2018-09-27 NOTE — Telephone Encounter (Signed)
I changed the order to CT abd/pelvis STAT  He needs to go ASAP per Dr. Redmond School.

## 2018-09-27 NOTE — Discharge Instructions (Addendum)
Please return for any problem.  Follow-up with your regular doctor as instructed.  Follow-up with GI on Monday as instructed.  Use Librium as prescribed.

## 2018-09-27 NOTE — Telephone Encounter (Signed)
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

## 2018-09-27 NOTE — ED Notes (Addendum)
Patient reports attempting to stop drinking alcohol x1 week. Last drink yesterday. Patient reports drinking martini yesterday.

## 2018-09-27 NOTE — Telephone Encounter (Signed)
See prior message

## 2018-09-27 NOTE — ED Triage Notes (Addendum)
Pt arrives after having outpatient CT, reports "swollen varices." Hx alcohol cirrhosis. Reports he's been trying to sober up, had a margarita yesterday.

## 2018-09-27 NOTE — Telephone Encounter (Signed)
Working on this

## 2018-09-28 LAB — SPECIMEN STATUS REPORT

## 2018-09-28 LAB — LIPASE: LIPASE: 573 U/L — AB (ref 13–78)

## 2018-09-30 ENCOUNTER — Encounter: Payer: Self-pay | Admitting: Family Medicine

## 2018-10-01 ENCOUNTER — Other Ambulatory Visit: Payer: Self-pay

## 2018-10-01 DIAGNOSIS — R748 Abnormal levels of other serum enzymes: Secondary | ICD-10-CM

## 2018-10-03 ENCOUNTER — Other Ambulatory Visit (INDEPENDENT_AMBULATORY_CARE_PROVIDER_SITE_OTHER): Payer: 59

## 2018-10-03 ENCOUNTER — Ambulatory Visit (INDEPENDENT_AMBULATORY_CARE_PROVIDER_SITE_OTHER): Payer: 59 | Admitting: Physician Assistant

## 2018-10-03 ENCOUNTER — Encounter: Payer: Self-pay | Admitting: Physician Assistant

## 2018-10-03 VITALS — BP 148/88 | HR 98 | Ht 68.0 in | Wt 245.5 lb

## 2018-10-03 DIAGNOSIS — R5383 Other fatigue: Secondary | ICD-10-CM

## 2018-10-03 DIAGNOSIS — R609 Edema, unspecified: Secondary | ICD-10-CM

## 2018-10-03 DIAGNOSIS — K7030 Alcoholic cirrhosis of liver without ascites: Secondary | ICD-10-CM

## 2018-10-03 DIAGNOSIS — R935 Abnormal findings on diagnostic imaging of other abdominal regions, including retroperitoneum: Secondary | ICD-10-CM | POA: Diagnosis not present

## 2018-10-03 DIAGNOSIS — G9340 Encephalopathy, unspecified: Secondary | ICD-10-CM

## 2018-10-03 DIAGNOSIS — K7011 Alcoholic hepatitis with ascites: Secondary | ICD-10-CM | POA: Diagnosis not present

## 2018-10-03 LAB — COMPREHENSIVE METABOLIC PANEL
ALBUMIN: 3 g/dL — AB (ref 3.5–5.2)
ALK PHOS: 182 U/L — AB (ref 39–117)
ALT: 44 U/L (ref 0–53)
AST: 121 U/L — AB (ref 0–37)
BUN: 10 mg/dL (ref 6–23)
CALCIUM: 8.3 mg/dL — AB (ref 8.4–10.5)
CHLORIDE: 99 meq/L (ref 96–112)
CO2: 31 mEq/L (ref 19–32)
CREATININE: 0.6 mg/dL (ref 0.40–1.50)
GFR: 147.18 mL/min (ref >60.00)
Glucose, Bld: 99 mg/dL (ref 70–99)
Potassium: 3 mEq/L — ABNORMAL LOW (ref 3.5–5.1)
SODIUM: 138 meq/L (ref 135–145)
TOTAL PROTEIN: 7.3 g/dL (ref 6.0–8.3)
Total Bilirubin: 4.3 mg/dL — ABNORMAL HIGH (ref 0.2–1.2)

## 2018-10-03 LAB — CBC WITH DIFFERENTIAL/PLATELET
Basophils Absolute: 0.1 10*3/uL (ref 0.0–0.1)
Basophils Relative: 0.9 % (ref 0.0–3.0)
EOS ABS: 0.1 10*3/uL (ref 0.0–0.7)
Eosinophils Relative: 1.3 % (ref 0.0–5.0)
HEMATOCRIT: 42.3 % (ref 39.0–52.0)
HEMOGLOBIN: 14.6 g/dL (ref 13.0–17.0)
LYMPHS PCT: 16.6 % (ref 12.0–46.0)
Lymphs Abs: 1.6 10*3/uL (ref 0.7–4.0)
MCHC: 34.4 g/dL (ref 30.0–36.0)
MCV: 111.5 fl — ABNORMAL HIGH (ref 78.0–100.0)
MONO ABS: 1.1 10*3/uL — AB (ref 0.1–1.0)
Monocytes Relative: 12.1 % — ABNORMAL HIGH (ref 3.0–12.0)
Neutro Abs: 6.5 10*3/uL (ref 1.4–7.7)
Neutrophils Relative %: 69.1 % (ref 43.0–77.0)
Platelets: 176 10*3/uL (ref 150.0–400.0)
RBC: 3.8 Mil/uL — ABNORMAL LOW (ref 4.22–5.81)
RDW: 13.9 % (ref 11.5–15.5)
WBC: 9.5 10*3/uL (ref 4.0–10.5)

## 2018-10-03 LAB — FERRITIN: Ferritin: 377.1 ng/mL — ABNORMAL HIGH (ref 22.0–322.0)

## 2018-10-03 LAB — PROTIME-INR
INR: 1.7 ratio — AB (ref 0.8–1.0)
Prothrombin Time: 19.5 s — ABNORMAL HIGH (ref 9.6–13.1)

## 2018-10-03 MED ORDER — RIFAXIMIN 550 MG PO TABS: 550.0000 mg | ORAL_TABLET | Freq: Two times a day (BID) | ORAL | 11 refills | Status: DC

## 2018-10-03 MED ORDER — LACTULOSE 10 GM/15ML PO SOLN: ORAL | 0 refills | Status: DC

## 2018-10-03 MED ORDER — NADOLOL 40 MG PO TABS: ORAL_TABLET | ORAL | 6 refills | Status: DC

## 2018-10-03 NOTE — Patient Instructions (Addendum)
Your provider has requested that you go to the basement level for lab work before leaving today. Press "B" on the elevator. The lab is located at the first door on the left as you exit the elevator.  Stop the Tenoretic.  We sent prescriptions to [facility]. 1. Xifaxan 550 mg 2. Lactulose 3. Corgard.  Follow a 2 gram sodium diet. Follow up with Dr. Zenovia Jarred on 11-22-2017 at 3:00 PM. Follow up with Amy prior to that if needed.  Call us for an appointment.  We have provided you with a note for work to be out today, tomorrow ( 11-15) . You can return on Monday 10-07-2018.  Normal BMI (Body Mass Index- based on height and weight) is between 19 and 25. Your BMI today is Body mass index is 37.33 kg/m. Marland Kitchen Please consider follow up  regarding your BMI with your Primary Care Provider.

## 2018-10-03 NOTE — Progress Notes (Addendum)
Subjective:    Patient ID: Christopher Craig, male    DOB: 06/13/1961, 57 y.o.   MRN: [id number]  HPI Christopher Craig is a 57 year old Hispanic male, referred today by Dr. Lanice Shirts office and known to Dr. Hilarie Fredrickson colonoscopy done in January 2016. Patient referred back today with abnormal CT scan and labs. Colonoscopy in 2016 with finding of 2 small polyps in the transverse colon and mild pandiverticulosis.  Biopsy showed one tubular adenoma.  Patient says his current work-up was initiated after his coworkers commented that he had not been looking well or acting like himself and encouraged him to be seen by his physician. He had labs done on 09/26/2018 with WBC of 12.3, hemoglobin 14.5 platelet count of 114.  T bili 4.9, alk phos 231, AST 192 and ALT 52.  Lipase was 573. He subsequently had CT of the abdomen and pelvis with contrast on 09/27/2018 showing a nodular liver consistent with cirrhosis and innumerable scattered punctate and subcentimeter hypodensities region regenerating or dysplastic nodules.  Also with jejunal circumferential wall thickening and mucosal enhancement, perigastric and paraesophageal varices and multiple enlarged periportal and portal caval lymph nodes and peripancreatic nodes measuring up to 1.7 cm.  Per radiology question portal enteropathy and reactive nodes, MRI was recommended.  Patient says that he had been feeling fine and He denies any abdominal discomfort.  He does admit that he is been a heavy drinker for many years, and generally drinks a pint to 1/5 of rum per day.  He stopped drinking about 2 weeks ago and says that he did get "the shakes.  He had been given a limited supply of Librium which he has finished.  He says at times he still gets shaky.  His only complaint is feeling a little bit fatigued or sluggish early in the mornings.  He has noticed some "wobbliness and unsteadiness.  No nausea or vomiting again no abdominal discomfort.  He says he always has swollen ankles and  says they have been swollen for years he had some complication after taking Exforge in the past. He has no family history of liver disease that he is aware of, and has had prior hepatitis B vaccination.  Review of Systems Pertinent positive and negative review of systems were noted in the above HPI section.  All other review of systems was otherwise negative.  Outpatient Encounter Medications as of 10/03/2018  Medication Sig  . atenolol-chlorthalidone (TENORETIC) 100-25 MG tablet Take 1 tablet by mouth daily.  . betamethasone valerate ointment (VALISONE) 0.1 % APPLY TOPICALLY 2 (TWO) TIMES DAILY.  . chlordiazePOXIDE (LIBRIUM) 25 MG capsule 25m PO TID x 1D, then 25-555mPO BID X 1D, then 25-5030mO QD X 1D  . lisinopril (PRINIVIL,ZESTRIL) 10 MG tablet Take 1 tablet (10 mg total) by mouth daily.  . Multiple Vitamins-Minerals (MULTIVITAMIN WITH MINERALS) tablet Take 1 tablet by mouth daily.  . Omega-3 Fatty Acids (FISH OIL) 1200 MG CAPS Take 1,200 mg by mouth 2 (two) times daily.  . tMarland Kitcheniamcinolone cream (KENALOG) 0.5 % Apply 1 application topically 2 (two) times daily as needed.  . clobetasol cream (TEMOVATE) 0.06.30APPLY 1 APPLICATION TOPICALLY 2 TIMES DAILY. (Patient not taking: Reported on 02/28/2018)  . lactulose (CHRONULAC) 10 GM/15ML solution Take 30 cc's by mouth twice daily every day.  . nadolol (CORGARD) 40 MG tablet Take 1 tablet by mouth every morning.  . rifaximin (XIFAXAN) 550 MG TABS tablet Take 1 tablet (550 mg total) by mouth 2 (two) times daily.  .Marland Kitchen  sildenafil (VIAGRA) 100 MG tablet Take 1 tablet (100 mg total) by mouth daily as needed for erectile dysfunction. (Patient not taking: Reported on 02/28/2018)  . tadalafil (CIALIS) 20 MG tablet Take 1 tablet (20 mg total) by mouth daily as needed for erectile dysfunction. (Patient not taking: Reported on 10/03/2018)   No facility-administered encounter medications on file as of 10/03/2018.    Allergies  Allergen Reactions  . Bactrim Ds  [Sulfamethoxazole-Trimethoprim] Diarrhea   Patient Active Problem List   Diagnosis Date Noted  . Adenomatous polyp of colon 02/28/2018  . Erectile dysfunction 02/28/2018  . History of colonic polyps 10/27/2014  . Arthritis 10/27/2014  . Eczema 03/21/2013  . Hypertension 02/06/2012  . Obesity (BMI 30-39.9) 02/06/2012   Social History   Socioeconomic History  . Marital status: Divorced    Spouse name: Not on file  . Number of children: Not on file  . Years of education: Not on file  . Highest education level: Not on file  Occupational History  . Not on file  Social Needs  . Financial resource strain: Not on file  . Food insecurity:    Worry: Not on file    Inability: Not on file  . Transportation needs:    Medical: Not on file    Non-medical: Not on file  Tobacco Use  . Smoking status: Never Smoker  . Smokeless tobacco: Never Used  Substance and Sexual Activity  . Alcohol use: Not Currently    Alcohol/week: 20.0 standard drinks    Types: 20 Cans of beer per week    Comment: social  . Drug use: No  . Sexual activity: Not Currently  Lifestyle  . Physical activity:    Days per week: Not on file    Minutes per session: Not on file  . Stress: Not on file  Relationships  . Social connections:    Talks on phone: Not on file    Gets together: Not on file    Attends religious service: Not on file    Active member of club or organization: Not on file    Attends meetings of clubs or organizations: Not on file    Relationship status: Not on file  . Intimate partner violence:    Fear of current or ex partner: Not on file    Emotionally abused: Not on file    Physically abused: Not on file    Forced sexual activity: Not on file  Other Topics Concern  . Not on file  Social History Narrative  . Not on file    Mr. Dross's family history includes Alzheimer's disease in his father; Arthritis in his mother; Asthma in his brother; Diabetes in his father; Hypertension in his  father and mother; Parkinson's disease in his father; Stroke in his father.      Objective:    Vitals:   10/03/18 0851  BP: (!) 148/88  Pulse: 98    Physical Exam; well-developed older Hispanic male in no acute distress, pleasant, somewhat chronically ill-appearing.  Blood pressure 148/88, pulse 98, height 5 foot 8, weight 245, BMI 37.3.  HEENT; nontraumatic normocephalic EOMI PERRLA, sclera are anicteric, oral mucosa moist, Cardiovascular ;regular rate and rhythm with S1-S2 no murmur rub or gallop, Pulmonary;clear bilaterally, Abdomen; large, nontender,  non-tense ascites ,no palpable mass, or hepatosplenomegaly and bowel sounds are present, Rectal; exam not done, Extremities ;2+ edema in the ankles and chronic stasis changes.  Neuro psych ;alert and oriented, grossly nonfocal, mentating fairly well, positive asterixis  Assessment & Plan:   #29 57 year old Hispanic male alcoholic drinking actively until 2 weeks ago who comes in today with elevated LFTs, and other lab parameters concerning for cirrhosis, and alcoholic hepatitis.  Recent CT imaging confirming cirrhosis mild to moderate ascites.  Gastric and paraesophageal varices as well as multiple enlarged periportal portacaval and peripancreatic lymph nodes, circumferential jejunal wall thickening and mucosal enhancement and innumerable scattered punctate and subcentimeter hypodensities in the liver of unclear etiology question dysplastic nodules.  He is symptomatic primarily standpoint of fatigue and I am suspicious he may also have a component of hepatic encephalopathy.  Discriminant function  as of 09/27/2018 =28 MELD score =16 as of 09/27/2018  #2 coagulopathy-INR 1.73 #3 thrombocytopenia #4 history of hypertension #5.  History of adenomatous colon polyps-due for follow-up January 2021 #6.  Diverticulosis  Plan; Patient's primary concern today is ability to return to work, he wants to return to work and feels that he can  function at his job.  I gave him a work note to remain out of work today and tomorrow and to return on Monday.  We discussed MRI, and indications to rule out malignancy in addition to cirrhosis.  He declines to have MRI at this time as it will be too expensive.  Will obtain CBC, Cmet, INR, AFP, chronic hepatitis serologies, venous ammonia  Stop Tenoretic Start Corgard 40 mg p.o. every morning. Start lactulose 30 cc p.o. twice daily Start Xifaxan 550 mg p.o. twice daily Patient will need EGD for surveillance of varices.  He is reluctant to have any procedures done at this time.  Perhaps if we can get him a bit further out from withdrawal he will be more been amenable to further work-up.  Ideally he should also have diagnostic paracentesis.  Initiate 2 g sodium diet I am not starting any diuretics today as he says his peripheral edema is chronic, and present for years, and he does not have any tense ascites.  We discussed medical importance of continued complete abstinence from alcohol .  He says he stopped 2 weeks ago and has no plans to resume alcohol use.  He has stopped drinking successfully in the past.  He knows his life expectancy will be significantly shortened with continued EtOH abuse.  Have scheduled him a follow-up appointment with myself in about 3 weeks, and will follow-up with Dr. Hilarie Fredrickson in about 6 weeks Further plans pending results of labs.   Ollen Rao S Carolyna Yerian PA-C 10/03/2018   Cc: Denita Lung, MD  Addendum: Reviewed and agree with assessment and management plan. Agree with short interval follow-up as patient is high risk for further decompensation and need for hospitalization EGD and MRI liver are strongly recommended and we can discuss further when he follows up in a few weeks. Pyrtle, Lajuan Lines, MD

## 2018-10-04 LAB — HEPATITIS B SURFACE ANTIBODY,QUALITATIVE: HEP B S AB: REACTIVE — AB

## 2018-10-04 LAB — AFP TUMOR MARKER: AFP-Tumor Marker: 5.9 ng/mL (ref <6.1)

## 2018-10-04 LAB — HEPATITIS C ANTIBODY
HEP C AB: NONREACTIVE
SIGNAL TO CUT-OFF: 0.03 (ref <1.00)

## 2018-10-04 LAB — HEPATITIS B SURFACE ANTIGEN: Hepatitis B Surface Ag: NONREACTIVE

## 2018-10-04 LAB — HEPATITIS A ANTIBODY, TOTAL: Hepatitis A AB,Total: NONREACTIVE

## 2018-10-07 ENCOUNTER — Telehealth: Payer: Self-pay | Admitting: *Deleted

## 2018-10-07 NOTE — Telephone Encounter (Signed)
On 10-07-2018, did prior authorization for Xifaxan 550 mg with [employer]. Sent script to [facility] on 10-03-2018/

## 2018-10-15 ENCOUNTER — Telehealth: Payer: Self-pay | Admitting: *Deleted

## 2018-10-15 ENCOUNTER — Other Ambulatory Visit: Payer: Self-pay | Admitting: *Deleted

## 2018-10-15 ENCOUNTER — Other Ambulatory Visit: Payer: Self-pay

## 2018-10-15 DIAGNOSIS — K7030 Alcoholic cirrhosis of liver without ascites: Secondary | ICD-10-CM

## 2018-10-15 MED ORDER — FOLIC ACID 1 MG PO TABS: 1.0000 mg | ORAL_TABLET | Freq: Every day | ORAL | 3 refills | Status: DC

## 2018-10-15 MED ORDER — LACTULOSE 10 GM/15ML PO SOLN: ORAL | 1 refills | Status: DC

## 2018-10-15 MED ORDER — VITAMIN B-12 1000 MCG PO TABS: 1000.0000 ug | ORAL_TABLET | Freq: Every day | ORAL | 3 refills | Status: DC

## 2018-10-15 MED ORDER — RIFAXIMIN 550 MG PO TABS: 550.0000 mg | ORAL_TABLET | Freq: Two times a day (BID) | ORAL | 11 refills | Status: DC

## 2018-10-15 NOTE — Telephone Encounter (Signed)
Spoke to Engineer, production at [facility]).  Tried to add the Hepatic Encephalopathy to his diagnosis codes. They had denied the Xifaxan 550 mg Prior authorization.  They said that he needs to take the Lactulose and try and fail it before I can redo the PA for the Xifaxan 500 mg.  Called the patient and he just started the Lactulose 30 ml;s twice daily.  I told him I will call him in 1-2 weeks to see if it is helping him.  If not I will redo the prior authorization for the Xifaxan 550 mg with the added diagnosis code of Hepatic Encephalopathy.

## 2018-10-15 NOTE — Telephone Encounter (Signed)
Called [facility] to ask if they go word on whether this Xifaxan 550 mg got approved for this patient that Nicoletta Ba PA prescribed 10-03-2018.  I did this prior authorization through Cover My Meds. The PA was denied.  In Amy Esterwood PA's abscence, I will forward this information to Dr. Zenovia Jarred. This patient is Dr. Vena Rua patient.

## 2018-10-18 ENCOUNTER — Encounter: Payer: Self-pay | Admitting: Family Medicine

## 2018-10-21 ENCOUNTER — Telehealth: Payer: Self-pay | Admitting: Physician Assistant

## 2018-10-21 ENCOUNTER — Telehealth: Payer: Self-pay | Admitting: *Deleted

## 2018-10-21 ENCOUNTER — Encounter: Payer: Self-pay | Admitting: Family Medicine

## 2018-10-21 NOTE — Telephone Encounter (Signed)
See note from 10-15-2018.

## 2018-10-21 NOTE — Telephone Encounter (Signed)
It would be ideal to check his weight today and for the next 2-days. If his weight is increased from his priors and continues to be, then we should consider the use of Spironolactone rather than combination Tenoretic since he has been initiated on Corgard. Please let myself or DOD know results and then may consider initiation of Spironolactone 25 mg or 50 mg daily. Thank you.

## 2018-10-21 NOTE — Telephone Encounter (Signed)
-----   Message from Jerene Bears, MD sent at 10/16/2018 11:00 AM EST ----- Regarding: RE: Nicoletta Ba PA's patient.  Ok thanks [employer] I agree with the lactulose  ----- Message ----- From: Tonette Bihari, CMA Sent: 10/15/2018   4:26 PM EST To: Jerene Bears, MD Subject: RE: Nicoletta Ba PA's patient.                Dr. Hilarie Fredrickson, I called [employer] and tried to talk to the prior authorization department.  They said they did not approve the Xifaxan 550 mg because he did not try Lactulose.  I will sent a prescription for that with you prescribing that since Amy isn't here. I  Hope that is ok with you. I will ask the patient to let us know if the Lactulose helps. If he has trouble with it I will redo the Prior Authorization with the Hepatic Encephalopathy code.  Thanks,  Marisue Humble CMA ----- Message ----- From: Jerene Bears, MD Sent: 10/15/2018   3:06 PM EST To: Tonette Bihari, CMA Subject: RE: Nicoletta Ba PA's patient.                Antonious Omahoney,  Are we sure that hepatic encephalopathy was the code used to try to get this medication covered If not we need to code it as hepatic encephalopathy with a dose of 550 mg twice daily Otherwise would begin lactulose 30 mL's twice daily, titrated to 2-3 bowel movements daily JMP  ----- Message ----- From: Tonette Bihari, CMA Sent: 10/15/2018   2:22 PM EST To: Jerene Bears, MD Subject: Nicoletta Ba PA's patient.                    Dr. Hilarie Fredrickson, In [facility] PA-C's absence ,I am asking you for advice with this situation.  This is your patient, you have scoped several times. She saw this patient on 10-01-2018.  She prescribed Xifaxan 550 mg, on 11- 14- 2010. I did a prior authorization through Cover my Meds. Talked to them today and it is denied.   Would you please look at this and let me know if you think he should be prescribed an alternative?   Thank you,  Marisue Humble CMA

## 2018-10-21 NOTE — Telephone Encounter (Signed)
Doc of the Day Patient of Dr Hilarie Fredrickson and last seen by Nicoletta Ba, PA-C Diagnosis is cirrhosis of liver. Not on any diuretics. On 10/03/18 he was instructed to stop the Tenoretic.  Calls today with complaints of LE edema to his thighs. He is feeling uncomfortable especially in the thigh area. He thinks that is because the pants are now very tight on him. Returns to care here on 10/28/18. BUN and Creatinine WNL's on 10/03/18. Please advise.

## 2018-10-22 NOTE — Telephone Encounter (Signed)
Patient will call in 3 days with 3 weights.

## 2018-10-23 ENCOUNTER — Telehealth: Payer: Self-pay | Admitting: Physician Assistant

## 2018-10-23 ENCOUNTER — Other Ambulatory Visit: Payer: Self-pay

## 2018-10-23 DIAGNOSIS — K7030 Alcoholic cirrhosis of liver without ascites: Secondary | ICD-10-CM

## 2018-10-23 MED ORDER — FUROSEMIDE 20 MG PO TABS: 20.0000 mg | ORAL_TABLET | Freq: Every day | ORAL | 0 refills | Status: DC

## 2018-10-23 MED ORDER — SPIRONOLACTONE 50 MG PO TABS: 50.0000 mg | ORAL_TABLET | Freq: Every day | ORAL | 0 refills | Status: DC

## 2018-10-23 NOTE — Telephone Encounter (Signed)
Weight is 273.  Previously 246 Please advise.

## 2018-10-23 NOTE — Telephone Encounter (Signed)
Christopher Craig - he needs to be on a 2 gm sodium diet - low sodium diet , needs to come for labs today or in am - CBC, CMET INR, ammonia .  He should weigh himself daily  And record Can start Lasix 20 mg po qam, and aldactone 50 mg po qam - one month supply of each  He has to come for the labs !  The Tenoretic I told him to stop was for BP , and I started Corgard at last visit- he has not been on any diuretics.   He has appt next week  I believe - if not I need to see him  Monday or Tuesday  Thanks!

## 2018-10-23 NOTE — Telephone Encounter (Signed)
Information relayed to the patient. Prescriptions to [facility]. Patient is scheduled to return on Monday 10/28/18 at 3:30 pm. He had already been instructed to return for labs on 10/25/18 but will try to come in for labs on 10/24/18. Diet modification. Will need written instructions.

## 2018-10-23 NOTE — Telephone Encounter (Signed)
Please see the telephone note of 10/21/18.

## 2018-10-24 ENCOUNTER — Other Ambulatory Visit (INDEPENDENT_AMBULATORY_CARE_PROVIDER_SITE_OTHER): Payer: 59

## 2018-10-24 DIAGNOSIS — K7030 Alcoholic cirrhosis of liver without ascites: Secondary | ICD-10-CM | POA: Diagnosis not present

## 2018-10-24 LAB — AMMONIA: Ammonia: 55 umol/L — ABNORMAL HIGH (ref 11–35)

## 2018-10-25 ENCOUNTER — Encounter: Payer: Self-pay | Admitting: Family Medicine

## 2018-10-25 ENCOUNTER — Other Ambulatory Visit (INDEPENDENT_AMBULATORY_CARE_PROVIDER_SITE_OTHER): Payer: 59

## 2018-10-25 DIAGNOSIS — K7030 Alcoholic cirrhosis of liver without ascites: Secondary | ICD-10-CM | POA: Diagnosis not present

## 2018-10-25 LAB — COMPREHENSIVE METABOLIC PANEL
ALT: 19 U/L (ref 0–53)
AST: 53 U/L — AB (ref 0–37)
Albumin: 2.6 g/dL — ABNORMAL LOW (ref 3.5–5.2)
Alkaline Phosphatase: 195 U/L — ABNORMAL HIGH (ref 39–117)
BUN: 9 mg/dL (ref 6–23)
CHLORIDE: 98 meq/L (ref 96–112)
CO2: 35 meq/L — AB (ref 19–32)
Calcium: 8.5 mg/dL (ref 8.4–10.5)
Creatinine, Ser: 0.74 mg/dL (ref 0.40–1.50)
GFR: 115.52 mL/min (ref >60.00)
Glucose, Bld: 133 mg/dL — ABNORMAL HIGH (ref 70–99)
POTASSIUM: 3.5 meq/L (ref 3.5–5.1)
Sodium: 139 mEq/L (ref 135–145)
Total Bilirubin: 2 mg/dL — ABNORMAL HIGH (ref 0.2–1.2)
Total Protein: 7 g/dL (ref 6.0–8.3)

## 2018-10-25 LAB — CBC WITH DIFFERENTIAL/PLATELET
BASOS ABS: 0 10*3/uL (ref 0.0–0.1)
Basophils Relative: 0.5 % (ref 0.0–3.0)
Eosinophils Absolute: 0.3 10*3/uL (ref 0.0–0.7)
Eosinophils Relative: 2.5 % (ref 0.0–5.0)
HCT: 42.1 % (ref 39.0–52.0)
Hemoglobin: 14.2 g/dL (ref 13.0–17.0)
LYMPHS ABS: 2.2 10*3/uL (ref 0.7–4.0)
Lymphocytes Relative: 20.4 % (ref 12.0–46.0)
MCHC: 33.8 g/dL (ref 30.0–36.0)
MCV: 110 fl — AB (ref 78.0–100.0)
MONO ABS: 1.2 10*3/uL — AB (ref 0.1–1.0)
MONOS PCT: 11 % (ref 3.0–12.0)
NEUTROS ABS: 7 10*3/uL (ref 1.4–7.7)
NEUTROS PCT: 65.6 % (ref 43.0–77.0)
PLATELETS: 211 10*3/uL (ref 150.0–400.0)
RBC: 3.83 Mil/uL — ABNORMAL LOW (ref 4.22–5.81)
RDW: 13.7 % (ref 11.5–15.5)
WBC: 10.7 10*3/uL — ABNORMAL HIGH (ref 4.0–10.5)

## 2018-10-25 LAB — PROTIME-INR
INR: 1.6 ratio — AB (ref 0.8–1.0)
Prothrombin Time: 18.8 s — ABNORMAL HIGH (ref 9.6–13.1)

## 2018-10-28 ENCOUNTER — Ambulatory Visit (INDEPENDENT_AMBULATORY_CARE_PROVIDER_SITE_OTHER): Payer: 59 | Admitting: Physician Assistant

## 2018-10-28 ENCOUNTER — Encounter: Payer: Self-pay | Admitting: Physician Assistant

## 2018-10-28 VITALS — BP 132/76 | HR 87 | Ht 68.0 in | Wt 265.0 lb

## 2018-10-28 DIAGNOSIS — R188 Other ascites: Secondary | ICD-10-CM

## 2018-10-28 DIAGNOSIS — K7290 Hepatic failure, unspecified without coma: Secondary | ICD-10-CM

## 2018-10-28 MED ORDER — FUROSEMIDE 20 MG PO TABS: ORAL_TABLET | ORAL | 1 refills | Status: DC

## 2018-10-28 MED ORDER — SPIRONOLACTONE 50 MG PO TABS: ORAL_TABLET | ORAL | 1 refills | Status: DC

## 2018-10-28 NOTE — Patient Instructions (Addendum)
Please come to our lab, basement level week of 11-04-2018.  You have been scheduled for an endoscopy. Please follow written instructions given to you at your visit today. If you use inhalers (even only as needed), please bring them with you on the day of your procedure.  Continue Nadolol 40 mg, take 1 tablet by mouth every morning. 2 gram sodium diet. Weigh yourself daily.. Decrease Chronulac to 20 cc by mouth twice daily.  We increased 2 medications. 1. Lasix to 40 mg by mouth every morning.    (Take 2 / 20 mg tablets each morning) which equal 40 mg.  2. Increase Aldactone to 100 mg by mouth every morning . Take 2 tablets ( 50 mg tablets)  each morning which equal 100 mg.   Normal BMI (Body Mass Index- based on height and weight) is between 19 and 25. Your BMI today is Body mass index is 40.29 kg/m. Marland Kitchen Please consider follow up  regarding your BMI with your Primary Care Provider.

## 2018-10-28 NOTE — Progress Notes (Addendum)
Subjective:    Patient ID: Christopher Craig, male    DOB: 1961-02-08, 57 y.o.   MRN: [id number]  HPI Christopher Craig is a pleasant 57 year old Hispanic male, who comes back in today for follow-up with recently diagnosed decompensated cirrhosis, and alcoholic hepatitis. He was seen on 10/03/2018 after he had undergone CT imaging which confirmed cirrhosis and moderate ascites.  Also found to have gastric and paraesophageal varices as well as multiple enlarged periportal and peri-caval as well as peripancreatic lymph nodes.  There was some circumferential jejunal wall thickening and mucosal enhancement and innumerable scattered punctate and subcentimeter hypodensities of the liver raising question of dysplastic nodules. Patient was primarily symptomatic with fatigue and clinically felt to be encephalopathic. Initial discriminant function score on 09/27/2018 was 28 and meld score was 16. Patient had just stopped drinking alcohol on a daily basis 2 weeks prior to that initial visit, and was completing a course of Librium. MRI was recommended because of CT findings as above.  Patient declined to have MRI due to cost. He was started on a 2 g sodium diet, Tenoretic was discontinued and Corgard started for portal hypertension, also started lactulose 30 cc twice daily Xifaxan 550 twice daily which we are still in the process of getting approval for. Additional labs were obtained. He called the office bout a week ago with significant increase in fluid retention both in his legs and his abdomen and weight was up about 20 pounds.  At that time we started Lasix 20 mg p.o. daily and Aldactone 50 mg p.o. daily and follow-up labs were done on 10/24/2018. Ammonia level of 55 INR improved at 1.6, creatinine 0.74, sodium 139, LFTs improved with T bili of 2.0 alk phos 195 AST of 53, platelets 211, hemoglobin 14.  He is back at work and says he is feeling much better overall but is uncomfortable with walking due to increase in  fluid in his abdomen and his legs.  He is also had some shortness of breath which he feels is due to abdominal girth.  On further questioning he says he has been abstinent from alcohol but did admit to drinking some Margarita flavored can drink which he says was only 8% alcohol. Weight is down about 8 pounds since starting diuretics.  He is having numerous bowel movements per day with loose stools greater than 5 times per day on lactulose 30 cc twice daily.  Review of Systems Pertinent positive and negative review of systems were noted in the above HPI section.  All other review of systems was otherwise negative.  Outpatient Encounter Medications as of 10/28/2018  Medication Sig  . chlordiazePOXIDE (LIBRIUM) 25 MG capsule 29m PO TID x 1D, then 25-545mPO BID X 1D, then 25-5051mO QD X 1D  . folic acid (FOLVITE) 1 MG tablet Take 1 tablet (1 mg total) by mouth daily.  . furosemide (LASIX) 20 MG tablet Take 2 tablets each morning to total 40 mg.  . lactulose (CHRONULAC) 10 GM/15ML solution Take 30 cc's by mouth twice daily every day.  . Multiple Vitamins-Minerals (MULTIVITAMIN WITH MINERALS) tablet Take 1 tablet by mouth daily.  . nadolol (CORGARD) 40 MG tablet Take 1 tablet by mouth every morning.  . Omega-3 Fatty Acids (FISH OIL) 1200 MG CAPS Take 1,200 mg by mouth 2 (two) times daily.  . sMarland Kitchenironolactone (ALDACTONE) 50 MG tablet Take 2 tablets each morning (, total 100 mg. )  . triamcinolone cream (KENALOG) 0.5 % Apply 1 application topically 2 (two)  times daily as needed.  . vitamin B-12 (CYANOCOBALAMIN) 1000 MCG tablet Take 1 tablet (1,000 mcg total) by mouth daily.  . [DISCONTINUED] furosemide (LASIX) 20 MG tablet Take 1 tablet (20 mg total) by mouth daily.  . [DISCONTINUED] spironolactone (ALDACTONE) 50 MG tablet Take 1 tablet (50 mg total) by mouth daily.  Marland Kitchen lisinopril (PRINIVIL,ZESTRIL) 10 MG tablet Take 1 tablet (10 mg total) by mouth daily. (Patient not taking: Reported on 10/28/2018)  .  sildenafil (VIAGRA) 100 MG tablet Take 1 tablet (100 mg total) by mouth daily as needed for erectile dysfunction. (Patient not taking: Reported on 02/28/2018)  . tadalafil (CIALIS) 20 MG tablet Take 1 tablet (20 mg total) by mouth daily as needed for erectile dysfunction. (Patient not taking: Reported on 10/03/2018)  . [DISCONTINUED] betamethasone valerate ointment (VALISONE) 0.1 % APPLY TOPICALLY 2 (TWO) TIMES DAILY.  . [DISCONTINUED] clobetasol cream (TEMOVATE) 5.17 % APPLY 1 APPLICATION TOPICALLY 2 TIMES DAILY. (Patient not taking: Reported on 02/28/2018)  . [DISCONTINUED] rifaximin (XIFAXAN) 550 MG TABS tablet Take 1 tablet (550 mg total) by mouth 2 (two) times daily.   No facility-administered encounter medications on file as of 10/28/2018.    Allergies  Allergen Reactions  . Bactrim Ds [Sulfamethoxazole-Trimethoprim] Diarrhea   Patient Active Problem List   Diagnosis Date Noted  . Adenomatous polyp of colon 02/28/2018  . Erectile dysfunction 02/28/2018  . History of colonic polyps 10/27/2014  . Arthritis 10/27/2014  . Eczema 03/21/2013  . Hypertension 02/06/2012  . Obesity (BMI 30-39.9) 02/06/2012   Social History   Socioeconomic History  . Marital status: Divorced    Spouse name: Not on file  . Number of children: Not on file  . Years of education: Not on file  . Highest education level: Not on file  Occupational History  . Not on file  Social Needs  . Financial resource strain: Not on file  . Food insecurity:    Worry: Not on file    Inability: Not on file  . Transportation needs:    Medical: Not on file    Non-medical: Not on file  Tobacco Use  . Smoking status: Never Smoker  . Smokeless tobacco: Never Used  Substance and Sexual Activity  . Alcohol use: Not Currently    Alcohol/week: 20.0 standard drinks    Types: 20 Cans of beer per week    Comment: social  . Drug use: No  . Sexual activity: Not Currently  Lifestyle  . Physical activity:    Days per week:  Not on file    Minutes per session: Not on file  . Stress: Not on file  Relationships  . Social connections:    Talks on phone: Not on file    Gets together: Not on file    Attends religious service: Not on file    Active member of club or organization: Not on file    Attends meetings of clubs or organizations: Not on file    Relationship status: Not on file  . Intimate partner violence:    Fear of current or ex partner: Not on file    Emotionally abused: Not on file    Physically abused: Not on file    Forced sexual activity: Not on file  Other Topics Concern  . Not on file  Social History Narrative  . Not on file    Mr. Rosekrans's family history includes Alzheimer's disease in his father; Arthritis in his mother; Asthma in his brother; Diabetes in his father;  Hypertension in his father and mother; Parkinson's disease in his father; Stroke in his father.      Objective:    Vitals:   10/28/18 1528  BP: 132/76  Pulse: 87    Physical Exam; well-developed older Hispanic male in no acute distress, pleasant.  He looks much better today than when last seen, and is mentating well.  Blood pressure 132/76, pulse 87, weight 265 which is up 20 pounds since last office visit about 3 weeks ago. HEENT; nontraumatic normocephalic EOMI PERRLA sclera are very minimally icteric, buccal mucosa moist, Cardiovascular; regular rate and rhythm with S1-S2, Pulmonary; mild decrease in breath sounds right base.  Abdomen ;very protuberant with fairly tense ascites, there is some pitting edema in the lower abdominal wall, he is nontender, bowel sounds are present no palpable mass or hepatomegaly.  Rectal; exam not done, Extremities ;2+ edema into the mid thigh bilaterally are some chronic stasis changes bilaterally as well.  Neuro; alert and oriented, grossly nonfocal, mood and affect appropriate, no tremor or asterixis and mentating very well.       Assessment & Plan:   #50 57 year old Hispanic male with  decompensated alcohol induced cirrhosis and alcoholic hepatitis.  He stopped drinking about weeks ago (but admitted to drinking an occasional agree to flavored beverage which was 8% alcohol).  He has had improvement in his hepatic parameters, and coagulopathy, and is tolerating low-dose diuretics now without difficulty and has had 8 pound weight decrease over the past 6 days.  Current MELD=11, improved   #2 hepatic encephalopathy-much improved on lactulose 30 cc twice daily which is causing numerous bowel movements per day. #3 EtOH withdrawal-resolved #4 anasarca-weight gain of 20 pounds since last office visit  #5 history of adenomatous colon polyps #6 abnormal CT imaging with periportal and portacaval and peripancreatic nodes and multiple innumerable punctate hypodensities in the liver question dysplastic nodules.  Adenopathy may be secondary to cirrhosis and EtOH hepatitis but cannot rule out underlying neoplasm.  Patient has declined MRI today due to cost  #7 patient is immune to hepatitis B  Plan; discussed need for complete abstinence from alcohol forever.  Patient voices understanding Continue 2 g sodium diet Increase Lasix to 40 mg p.o. every morning and increase Aldactone to 100 mg p.o. every morning. Repeat BMET in 1 week, AFP with next labs Patient will continue to weigh himself daily and is advised to call if he is not losing 1 to 2 pounds daily Continue lactulose but decreased to 20 cc p.o. twice daily Continue Corgard 40 mg p.o. every morning Patient will be scheduled for office follow-up with Dr. Hilarie Fredrickson in 3 to 4 weeks.  He will also be scheduled for surveillance upper endoscopy to assess esophageal varices. I am happy to follow-up in the interim as needed and after initial visit with Dr. Hilarie Fredrickson.    Bryton Waight S Tait Balistreri PA-C 10/28/2018   Addendum: Reviewed and agree with assessment and management plan. Pyrtle, Lajuan Lines, MD     Cc: Denita Lung, MD

## 2018-10-29 ENCOUNTER — Telehealth: Payer: Self-pay | Admitting: Internal Medicine

## 2018-10-29 NOTE — Telephone Encounter (Signed)
Amy please help with this.

## 2018-10-29 NOTE — Telephone Encounter (Signed)
Pt called wanting to know if he can get a letter for employer stating that he is under care of the provider so that he is able to use valet parking so that he is closer to his work station

## 2018-10-30 ENCOUNTER — Other Ambulatory Visit: Payer: Self-pay

## 2018-10-30 NOTE — Telephone Encounter (Signed)
Pt called requesting if letter can be sent through mychart.

## 2018-10-30 NOTE — Telephone Encounter (Signed)
I am fine with him having a note - if you want to draft one - very short , just saying he has significant health issues that make walking any distance difficult .Marland Kitchen Ill sign - if not - remind me and ill do a letter early next week !

## 2018-10-30 NOTE — Telephone Encounter (Signed)
Look for the drafted letter and make any changes you see needed. I routed it to you. He will print it from his My Chart account upon your approval. Thanks

## 2018-10-31 NOTE — Telephone Encounter (Signed)
I routed back to you - thanks

## 2018-10-31 NOTE — Telephone Encounter (Signed)
Routed the letter to the patient

## 2018-11-07 ENCOUNTER — Other Ambulatory Visit (INDEPENDENT_AMBULATORY_CARE_PROVIDER_SITE_OTHER): Payer: 59

## 2018-11-07 DIAGNOSIS — R188 Other ascites: Secondary | ICD-10-CM | POA: Diagnosis not present

## 2018-11-07 DIAGNOSIS — K7290 Hepatic failure, unspecified without coma: Secondary | ICD-10-CM | POA: Diagnosis not present

## 2018-11-07 LAB — BASIC METABOLIC PANEL
BUN: 10 mg/dL (ref 6–23)
CO2: 31 mEq/L (ref 19–32)
Calcium: 8.6 mg/dL (ref 8.4–10.5)
Chloride: 102 mEq/L (ref 96–112)
Creatinine, Ser: 0.68 mg/dL (ref 0.40–1.50)
GFR: 127.34 mL/min (ref >60.00)
Glucose, Bld: 104 mg/dL — ABNORMAL HIGH (ref 70–99)
Potassium: 3.9 mEq/L (ref 3.5–5.1)
Sodium: 140 mEq/L (ref 135–145)

## 2018-11-08 LAB — AFP TUMOR MARKER: AFP-Tumor Marker: 7.4 ng/mL — ABNORMAL HIGH (ref <6.1)

## 2018-11-21 ENCOUNTER — Other Ambulatory Visit: Payer: Self-pay | Admitting: Physician Assistant

## 2018-11-21 ENCOUNTER — Ambulatory Visit (AMBULATORY_SURGERY_CENTER): Payer: 59 | Admitting: Internal Medicine

## 2018-11-21 ENCOUNTER — Encounter: Payer: Self-pay | Admitting: Internal Medicine

## 2018-11-21 ENCOUNTER — Other Ambulatory Visit (INDEPENDENT_AMBULATORY_CARE_PROVIDER_SITE_OTHER): Payer: 59

## 2018-11-21 ENCOUNTER — Other Ambulatory Visit: Payer: Self-pay

## 2018-11-21 VITALS — BP 130/80 | HR 67 | Temp 97.5°F | Resp 14 | Ht 68.0 in | Wt 265.0 lb

## 2018-11-21 DIAGNOSIS — R748 Abnormal levels of other serum enzymes: Secondary | ICD-10-CM | POA: Diagnosis not present

## 2018-11-21 DIAGNOSIS — K2970 Gastritis, unspecified, without bleeding: Secondary | ICD-10-CM

## 2018-11-21 DIAGNOSIS — K3189 Other diseases of stomach and duodenum: Secondary | ICD-10-CM | POA: Diagnosis not present

## 2018-11-21 DIAGNOSIS — K7030 Alcoholic cirrhosis of liver without ascites: Secondary | ICD-10-CM | POA: Diagnosis not present

## 2018-11-21 DIAGNOSIS — K859 Acute pancreatitis without necrosis or infection, unspecified: Secondary | ICD-10-CM | POA: Diagnosis not present

## 2018-11-21 DIAGNOSIS — K852 Alcohol induced acute pancreatitis without necrosis or infection: Secondary | ICD-10-CM | POA: Diagnosis not present

## 2018-11-21 DIAGNOSIS — K2950 Unspecified chronic gastritis without bleeding: Secondary | ICD-10-CM | POA: Diagnosis not present

## 2018-11-21 DIAGNOSIS — B9681 Helicobacter pylori [H. pylori] as the cause of diseases classified elsewhere: Secondary | ICD-10-CM | POA: Diagnosis not present

## 2018-11-21 DIAGNOSIS — R609 Edema, unspecified: Secondary | ICD-10-CM | POA: Diagnosis not present

## 2018-11-21 DIAGNOSIS — K7460 Unspecified cirrhosis of liver: Secondary | ICD-10-CM

## 2018-11-21 DIAGNOSIS — K7290 Hepatic failure, unspecified without coma: Secondary | ICD-10-CM

## 2018-11-21 DIAGNOSIS — K2900 Acute gastritis without bleeding: Secondary | ICD-10-CM

## 2018-11-21 DIAGNOSIS — K766 Portal hypertension: Secondary | ICD-10-CM | POA: Diagnosis not present

## 2018-11-21 DIAGNOSIS — I10 Essential (primary) hypertension: Secondary | ICD-10-CM | POA: Diagnosis not present

## 2018-11-21 LAB — CBC WITH DIFFERENTIAL/PLATELET
Basophils Absolute: 0.1 10*3/uL (ref 0.0–0.1)
Basophils Relative: 0.7 % (ref 0.0–3.0)
EOS PCT: 2.6 % (ref 0.0–5.0)
Eosinophils Absolute: 0.2 10*3/uL (ref 0.0–0.7)
HCT: 43.4 % (ref 39.0–52.0)
Hemoglobin: 15.1 g/dL (ref 13.0–17.0)
Lymphocytes Relative: 33.3 % (ref 12.0–46.0)
Lymphs Abs: 2.9 10*3/uL (ref 0.7–4.0)
MCHC: 34.8 g/dL (ref 30.0–36.0)
MCV: 105.4 fl — ABNORMAL HIGH (ref 78.0–100.0)
Monocytes Absolute: 0.8 10*3/uL (ref 0.1–1.0)
Monocytes Relative: 8.9 % (ref 3.0–12.0)
NEUTROS ABS: 4.7 10*3/uL (ref 1.4–7.7)
Neutrophils Relative %: 54.5 % (ref 43.0–77.0)
PLATELETS: 193 10*3/uL (ref 150.0–400.0)
RBC: 4.12 Mil/uL — ABNORMAL LOW (ref 4.22–5.81)
RDW: 12.6 % (ref 11.5–15.5)
WBC: 8.6 10*3/uL (ref 4.0–10.5)

## 2018-11-21 LAB — PROTIME-INR
INR: 1.8 ratio — ABNORMAL HIGH (ref 0.8–1.0)
Prothrombin Time: 20.7 s — ABNORMAL HIGH (ref 9.6–13.1)

## 2018-11-21 MED ORDER — SODIUM CHLORIDE 0.9 % IV SOLN: 500.0000 mL | Freq: Once | INTRAVENOUS | Status: DC

## 2018-11-21 MED ORDER — PANTOPRAZOLE SODIUM 40 MG PO TBEC: 40.0000 mg | DELAYED_RELEASE_TABLET | Freq: Every day | ORAL | 3 refills | Status: DC

## 2018-11-21 NOTE — Progress Notes (Signed)
Pt's states no medical or surgical changes since previsit or office visit. 

## 2018-11-21 NOTE — Patient Instructions (Addendum)
YOU HAD AN ENDOSCOPIC PROCEDURE TODAY AT [facility]:   Refer to the procedure report that was given to you for any specific questions about what was found during the examination.  If the procedure report does not answer your questions, please call your gastroenterologist to clarify.  If you requested that your care partner not be given the details of your procedure findings, then the procedure report has been included in a sealed envelope for you to review at your convenience later.  YOU SHOULD EXPECT: Some feelings of bloating in the abdomen. Passage of more gas than usual.  Walking can help get rid of the air that was put into your GI tract during the procedure and reduce the bloating.   Please Note:  You might notice some irritation and congestion in your nose or some drainage.  This is from the oxygen used during your procedure.  There is no need for concern and it should clear up in a day or so.  SYMPTOMS TO REPORT IMMEDIATELY:    Following upper endoscopy (EGD)  Vomiting of blood or coffee ground material  New chest pain or pain under the shoulder blades  Painful or persistently difficult swallowing  New shortness of breath  Fever of 100F or higher  For urgent or emergent issues, a gastroenterologist can be reached at any hour by calling [phone]. NO NSAIDS: ALeve, Aspirin, Ibuprofen until further notice.  DIET:  We do recommend a small meal at first, but then you may proceed to your low salt diet..  Drink plenty of fluids but you should avoid alcoholic beverages for 24 hours.  It's very important that you do not drink alcohol in order for your stomach to heal.  ACTIVITY:  You should plan to take it easy for the rest of today.   No driving today, and no heavy machinery.  FOLLOW UP: Our staff will call the number listed on your records the next business day following your procedure to check on you and address any questions or concerns that you may have regarding  the information given to you following your procedure. If we do not reach you, we will leave a message.  However, if you are feeling well and you are not experiencing any problems, there is no need to return our call.  We will assume that you have returned to your regular daily activities without incident.  If any biopsies were taken you will be contacted by phone or by letter within the next 1-3 weeks.  Please call us at [phone] if you have not heard about the biopsies in 3 weeks.    SIGNATURES/CONFIDENTIALITY: You and/or your care partner have signed paperwork which will be entered into your electronic medical record.  These signatures attest to the fact that that the information above on your After Visit Summary has been reviewed and is understood.  Full responsibility of the confidentiality of this discharge information lies with you and/or your care-partner.  Read all handouts given to you by your recovery room nurse.

## 2018-11-21 NOTE — Progress Notes (Signed)
Called to room to assist during endoscopic procedure.  Patient ID and intended procedure confirmed with present staff. Received instructions for my participation in the procedure from the performing physician.  

## 2018-11-21 NOTE — Op Note (Signed)
[facility] Patient Name: Christopher Craig Procedure Date: 11/21/2018 3:45 PM MRN: [id number] Endoscopist: Jerene Bears , MD Age: 58 Referring MD:  Date of Birth: 09-10-1961 Gender: Male Account #: [id number] Procedure:                Upper GI endoscopy Indications:              Variceal screening (no known varices or prior                            bleeding, though suggested by CT abdomen from Nov                            2019); decompensated cirrhosis Medicines:                Monitored Anesthesia Care Procedure:                Pre-Anesthesia Assessment:                           - Prior to the procedure, a History and Physical                            was performed, and patient medications and                            allergies were reviewed. The patient's tolerance of                            previous anesthesia was also reviewed. The risks                            and benefits of the procedure and the sedation                            options and risks were discussed with the patient.                            All questions were answered, and informed consent                            was obtained. Prior Anticoagulants: The patient has                            taken no previous anticoagulant or antiplatelet                            agents. ASA Grade Assessment: III - A patient with                            severe systemic disease. After reviewing the risks                            and benefits, the patient was deemed in  satisfactory condition to undergo the procedure.                           After obtaining informed consent, the endoscope was                            passed under direct vision. Throughout the                            procedure, the patient's blood pressure, pulse, and                            oxygen saturations were monitored continuously. The                            Endoscope was introduced  through the mouth, and                            advanced to the second part of duodenum. The upper                            GI endoscopy was accomplished without difficulty.                            The patient tolerated the procedure well. Scope In: Scope Out: Findings:                 The examined esophagus was normal. No endoscopic                            evidence of varices (though patient already on                            nadolol).                           Mild portal hypertensive gastropathy was found in                            the cardia, in the gastric fundus and in the                            gastric body.                           Severe inflammation characterized by erythema,                            friability and shallow ulcerations was found in the                            gastric antrum. Biopsies were taken with a cold                            forceps for histology and Helicobacter pylori  testing.                           The examined duodenum was normal. Complications:            No immediate complications. Estimated Blood Loss:     Estimated blood loss was minimal. Impression:               - Normal esophagus. No endoscopic evidence of                            varices (though patient already on beta blocker                            (nadolol)).                           - Portal hypertensive gastropathy, mild.                           - Gastritis. Biopsied.                           - Normal examined duodenum. Recommendation:           - Patient has a contact number available for                            emergencies. The signs and symptoms of potential                            delayed complications were discussed with the                            patient. Return to normal activities tomorrow.                            Written discharge instructions were provided to the                             patient.                           - Low sodium diet indefinitely.                           - Continue present medications.                           - Add pantoprazole 40 mg once daily for ulcerative                            gastritis.                           - No aspirin, ibuprofen, naproxen, or other                            non-steroidal  anti-inflammatory drugs.                           - Await pathology results.                           - MRI abdomen with contrast (liver protocol) is                            recommended (nodularity, elevated AFP)                           - Repeat CBC, CMP, INR today.                           - Strict and complete alcohol abstinence. Jerene Bears, MD 11/21/2018 4:19:37 PM This report has been signed electronically.

## 2018-11-21 NOTE — Progress Notes (Signed)
Report to PACU, RN, vss, BBS= Clear.  

## 2018-11-22 ENCOUNTER — Telehealth: Payer: Self-pay | Admitting: *Deleted

## 2018-11-22 ENCOUNTER — Telehealth: Payer: Self-pay

## 2018-11-22 ENCOUNTER — Ambulatory Visit: Payer: 59 | Admitting: Internal Medicine

## 2018-11-22 ENCOUNTER — Other Ambulatory Visit: Payer: Self-pay

## 2018-11-22 DIAGNOSIS — K760 Fatty (change of) liver, not elsewhere classified: Secondary | ICD-10-CM

## 2018-11-22 LAB — COMPREHENSIVE METABOLIC PANEL
ALT: 17 U/L (ref 0–53)
AST: 39 U/L — ABNORMAL HIGH (ref 0–37)
Albumin: 3.2 g/dL — ABNORMAL LOW (ref 3.5–5.2)
Alkaline Phosphatase: 114 U/L (ref 39–117)
BUN: 9 mg/dL (ref 6–23)
CO2: 24 meq/L (ref 19–32)
Calcium: 9.5 mg/dL (ref 8.4–10.5)
Chloride: 100 mEq/L (ref 96–112)
Creatinine, Ser: 0.7 mg/dL (ref 0.40–1.50)
GFR: 123.14 mL/min (ref >60.00)
Glucose, Bld: 76 mg/dL (ref 70–99)
Potassium: 3.9 mEq/L (ref 3.5–5.1)
SODIUM: 137 meq/L (ref 135–145)
Total Bilirubin: 1.4 mg/dL — ABNORMAL HIGH (ref 0.2–1.2)
Total Protein: 7.8 g/dL (ref 6.0–8.3)

## 2018-11-22 LAB — LIPASE: Lipase: 38 U/L (ref 13–78)

## 2018-11-22 LAB — SPECIMEN STATUS REPORT

## 2018-11-22 NOTE — Telephone Encounter (Signed)
Pt scheduled for MR of abd at [facility] 11/28/18@3pm , pt to arrive there at 2:30pm and have nothing to eat or drink for 4 hours prior to the procedure. Spoke with pt and he wants the mri to be done in Feb at [facility]. Pt given the phone number [phone] to reschedule the appt to the date that works for him.

## 2018-11-22 NOTE — Telephone Encounter (Signed)
  Follow up Call-  Call back number 11/21/2018  Post procedure Call Back phone  # [phone]  Permission to leave phone message Yes  Some recent data might be hidden     Patient questions:  Do you have a fever, pain , or abdominal swelling? No. Pain Score  0 *  Have you tolerated food without any problems? Yes.    Have you been able to return to your normal activities? Yes.    Do you have any questions about your discharge instructions: Diet   No. Medications  No. Follow up visit  No.  Do you have questions or concerns about your Care? No.  Actions: * If pain score is 4 or above: No action needed, pain <4.

## 2018-11-26 ENCOUNTER — Other Ambulatory Visit: Payer: Self-pay

## 2018-11-26 MED ORDER — BIS SUBCIT-METRONID-TETRACYC 140-125-125 MG PO CAPS: 3.0000 | ORAL_CAPSULE | Freq: Three times a day (TID) | ORAL | 0 refills | Status: DC

## 2018-11-26 MED ORDER — PANTOPRAZOLE SODIUM 40 MG PO TBEC: 40.0000 mg | DELAYED_RELEASE_TABLET | Freq: Two times a day (BID) | ORAL | 0 refills | Status: DC

## 2018-11-27 ENCOUNTER — Telehealth: Payer: Self-pay | Admitting: Internal Medicine

## 2018-11-27 NOTE — Telephone Encounter (Signed)
Dr Pyrtle-please advise.... 

## 2018-11-27 NOTE — Telephone Encounter (Signed)
DNS -- see if you can separate the components of Pylera to make more affordable Also make sure he is using [facility] (as he is an employee) which will help with the price

## 2018-11-27 NOTE — Telephone Encounter (Signed)
Pt called stating that copay for antibiotic for H-pylori is over $100, he wants to know if he can have something more affordable.

## 2018-11-28 ENCOUNTER — Ambulatory Visit (HOSPITAL_COMMUNITY): Payer: 59

## 2018-11-28 ENCOUNTER — Encounter: Payer: Self-pay | Admitting: *Deleted

## 2018-11-28 MED ORDER — METRONIDAZOLE 250 MG PO TABS: 250.0000 mg | ORAL_TABLET | Freq: Two times a day (BID) | ORAL | 0 refills | Status: AC

## 2018-11-28 MED ORDER — DOXYCYCLINE HYCLATE 100 MG PO TABS: 100.0000 mg | ORAL_TABLET | Freq: Two times a day (BID) | ORAL | 0 refills | Status: AC

## 2018-11-28 MED ORDER — BISMUTH SUBSALICYLATE 262 MG PO CHEW: 524.0000 mg | CHEWABLE_TABLET | Freq: Four times a day (QID) | ORAL | 0 refills | Status: AC

## 2018-11-28 MED ORDER — LACTULOSE 20 GM/30ML PO SOLN: 30.0000 mL | Freq: Two times a day (BID) | ORAL | 1 refills | Status: DC

## 2018-11-28 NOTE — Telephone Encounter (Signed)
Having seen him last week, reviewing recent labs and now treating for H. pylori I am okay deferring his appointment but would recommend he follow-up in 4 to 6 weeks

## 2018-11-28 NOTE — Telephone Encounter (Signed)
Pt requested a CB regarding his med.

## 2018-11-28 NOTE — Telephone Encounter (Signed)
Spoke to patient and have rescheduled his appointment to 01/09/19 at 3pm. He verbalizes understanding.

## 2018-11-28 NOTE — Telephone Encounter (Signed)
I have spoken to patient to advise that we have split components of Pylera to make medications more affordable for him (he had already had pylera sent to [facility] and cost was $100). Patient verbalizes understanding.  In addition, patient requests more lactulose. I have sent a prescription for 20 ml twice daily which is what Amy Esterwood, PA-C ordered 10/28/2018.  Patient states he is scheduled for follow up with Dr Hilarie Fredrickson on Tuesday in office. He would like to know if he can hold off on visit for a while to see how H Pylori treatment works of if Dr Hilarie Fredrickson wants him to keep his follow up as it stands now.

## 2018-12-03 ENCOUNTER — Ambulatory Visit: Payer: 59 | Admitting: Internal Medicine

## 2018-12-09 ENCOUNTER — Other Ambulatory Visit: Payer: Self-pay

## 2018-12-09 DIAGNOSIS — K7460 Unspecified cirrhosis of liver: Secondary | ICD-10-CM

## 2018-12-09 MED ORDER — SPIRONOLACTONE 50 MG PO TABS: ORAL_TABLET | ORAL | 1 refills | Status: DC

## 2018-12-09 MED ORDER — FUROSEMIDE 20 MG PO TABS: ORAL_TABLET | ORAL | 1 refills | Status: DC

## 2018-12-09 NOTE — Telephone Encounter (Signed)
Please let patient know that he should continue with current dose of lasix and aldactone Repeat CMP, INR, CBC on 12/21/18 (or about)

## 2018-12-23 ENCOUNTER — Other Ambulatory Visit (INDEPENDENT_AMBULATORY_CARE_PROVIDER_SITE_OTHER): Payer: 59

## 2018-12-23 DIAGNOSIS — K7460 Unspecified cirrhosis of liver: Secondary | ICD-10-CM | POA: Diagnosis not present

## 2018-12-23 LAB — CBC WITH DIFFERENTIAL/PLATELET
Basophils Absolute: 0 10*3/uL (ref 0.0–0.1)
Basophils Relative: 0.6 % (ref 0.0–3.0)
Eosinophils Absolute: 0.2 10*3/uL (ref 0.0–0.7)
Eosinophils Relative: 2.5 % (ref 0.0–5.0)
HCT: 41.8 % (ref 39.0–52.0)
Hemoglobin: 14.5 g/dL (ref 13.0–17.0)
Lymphocytes Relative: 40.6 % (ref 12.0–46.0)
Lymphs Abs: 3.2 10*3/uL (ref 0.7–4.0)
MCHC: 34.8 g/dL (ref 30.0–36.0)
MCV: 101.7 fl — ABNORMAL HIGH (ref 78.0–100.0)
Monocytes Absolute: 0.8 10*3/uL (ref 0.1–1.0)
Monocytes Relative: 9.9 % (ref 3.0–12.0)
Neutro Abs: 3.6 10*3/uL (ref 1.4–7.7)
Neutrophils Relative %: 46.4 % (ref 43.0–77.0)
Platelets: 126 10*3/uL — ABNORMAL LOW (ref 150.0–400.0)
RBC: 4.11 Mil/uL — ABNORMAL LOW (ref 4.22–5.81)
RDW: 12.3 % (ref 11.5–15.5)
WBC: 7.8 10*3/uL (ref 4.0–10.5)

## 2018-12-23 LAB — COMPREHENSIVE METABOLIC PANEL
ALK PHOS: 104 U/L (ref 39–117)
ALT: 29 U/L (ref 0–53)
AST: 37 U/L (ref 0–37)
Albumin: 3.6 g/dL (ref 3.5–5.2)
BUN: 12 mg/dL (ref 6–23)
CO2: 28 mEq/L (ref 19–32)
Calcium: 9.5 mg/dL (ref 8.4–10.5)
Chloride: 100 mEq/L (ref 96–112)
Creatinine, Ser: 0.8 mg/dL (ref 0.40–1.50)
GFR: 99.28 mL/min (ref >60.00)
Glucose, Bld: 88 mg/dL (ref 70–99)
Potassium: 3.6 mEq/L (ref 3.5–5.1)
Sodium: 137 mEq/L (ref 135–145)
Total Bilirubin: 1.5 mg/dL — ABNORMAL HIGH (ref 0.2–1.2)
Total Protein: 7.2 g/dL (ref 6.0–8.3)

## 2018-12-23 LAB — PROTIME-INR
INR: 1.7 ratio — ABNORMAL HIGH (ref 0.8–1.0)
Prothrombin Time: 19.9 s — ABNORMAL HIGH (ref 9.6–13.1)

## 2018-12-24 ENCOUNTER — Other Ambulatory Visit: Payer: Self-pay

## 2018-12-24 ENCOUNTER — Encounter (HOSPITAL_COMMUNITY): Payer: Self-pay | Admitting: Radiology

## 2018-12-24 ENCOUNTER — Ambulatory Visit (HOSPITAL_COMMUNITY)
Admission: RE | Admit: 2018-12-24 | Discharge: 2018-12-24 | Disposition: A | Discharge status: Home / Self Care | Payer: 59 | Source: Ambulatory Visit | Attending: Internal Medicine | Admitting: Internal Medicine

## 2018-12-24 DIAGNOSIS — K7689 Other specified diseases of liver: Secondary | ICD-10-CM | POA: Diagnosis not present

## 2018-12-24 DIAGNOSIS — K760 Fatty (change of) liver, not elsewhere classified: Secondary | ICD-10-CM | POA: Diagnosis not present

## 2018-12-24 DIAGNOSIS — K7460 Unspecified cirrhosis of liver: Secondary | ICD-10-CM

## 2018-12-24 IMAGING — MR MR ABDOMEN WO/W CM
17 series · 48 of 48 positions shown · IV contrast (gadavist)
Comparison: No prior abdominal MRI. CT the abdomen and pelvis
[DATE].

CLINICAL DATA: 58-year-old male with history of cirrhosis or fatty
liver. No current abdominal complaints. History of alcoholic
hepatitis and pancreatitis with leukocytosis.

EXAM:
MRI ABDOMEN WITHOUT AND WITH CONTRAST
TECHNIQUE: Multiplanar multisequence MR imaging of the abdomen was performed
both before and after the administration of intravenous contrast.
CONTRAST:  9 mL of Gadavist

[Series 4: cor haste · coronal · 6.0mm · 1.31mm/px · 2 of 35 slices shown]
[im 1/35]
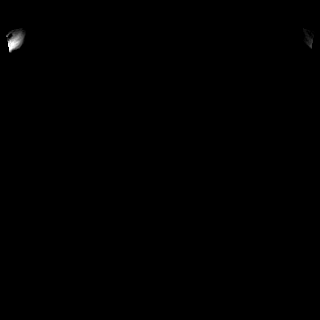
[im 35/35]
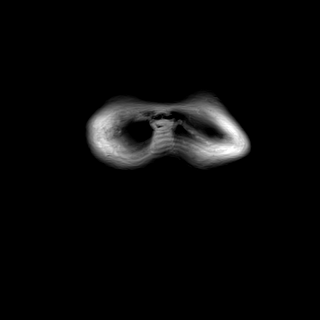

[Series 5: ax haste · axial · 6.0mm · 1.31mm/px · z∈[-154,+126]mm · 2 of 40 slices shown]
[im 1/40]
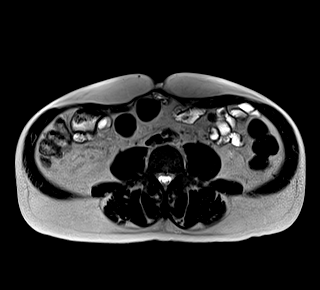
[im 40/40]
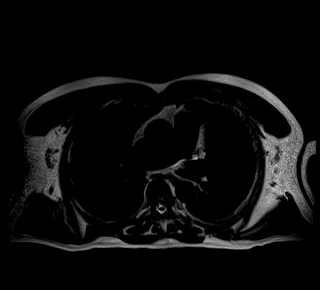

[Series 6: t1_vibe_opp-in_tra_p4_bh · axial · 3.0mm · 1.31mm/px · z∈[-158,+127]mm · 7 of 192 slices shown]
[im 1/192]
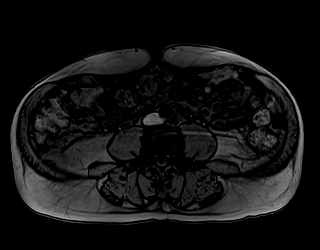
[im 32/192]
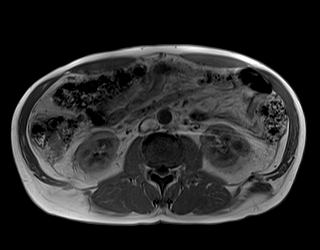
[im 64/192]
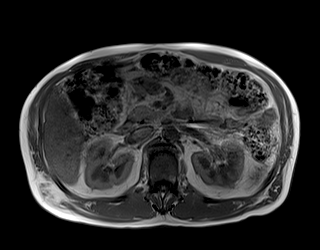
[im 96/192]
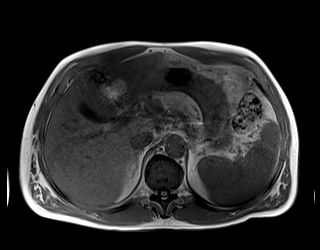
[im 128/192]
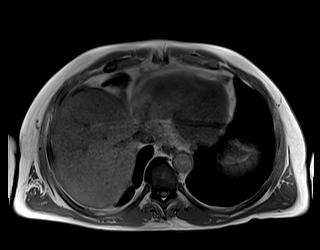
[im 160/192]
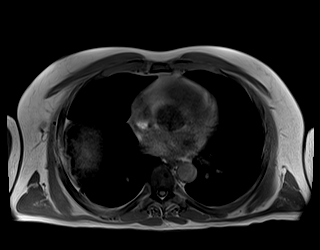
[im 192/192]
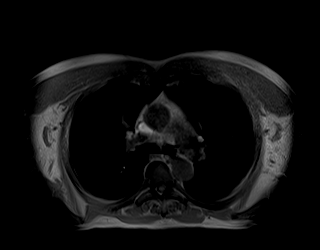

[Series 7: T2 fat-sat · axial · 6.0mm · 1.31mm/px · 1 of 40 slices shown]
[im 1/40]
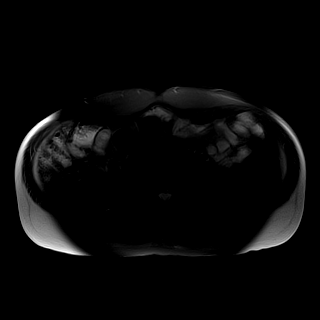

[Series 8: DWI · axial · 6.0mm · 1.57mm/px · z∈[-154,+126]mm · 4 of 120 slices shown (1 of 2)]
[im 1/120]
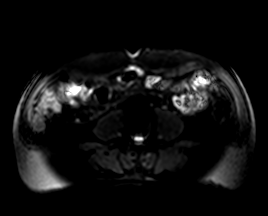
[im 40/120]
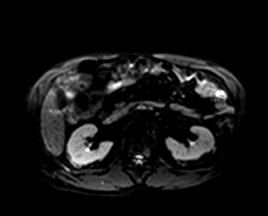
[im 80/120]
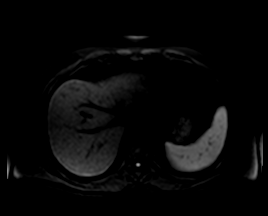
[im 120/120]
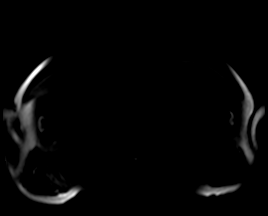

[Series 9: DWI · axial · 6.0mm · 1.57mm/px · 1 of 40 slices shown (2 of 2)]
[im 1/40]
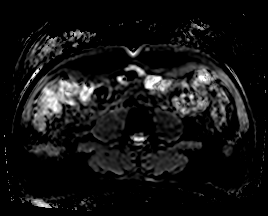

[Series 10: bSSFP · axial · 6.0mm · 0.82mm/px · 1 of 40 slices shown]
[im 1/40]
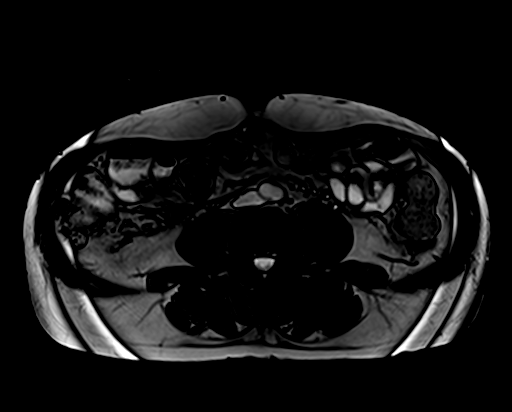

[Series 11: t1_vibe_fs_tra_p4_bh_pre · axial · 3.0mm · 1.31mm/px · z∈[-146,+115]mm · 3 of 88 slices shown]
[im 1/88]
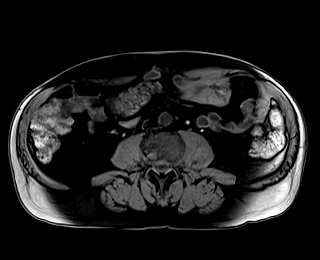
[im 44/88]
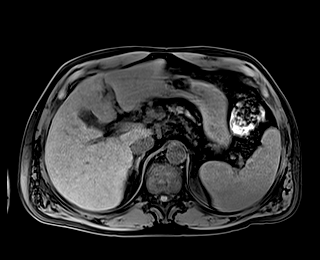
[im 88/88]
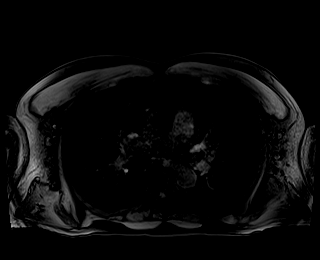

[Series 13: t1_vibe_fs_tra_p4_bh_post · axial · 3.0mm · 1.31mm/px · z∈[-146,+115]mm · 3 of 88 slices shown (1 of 4)]
[im 1/88]
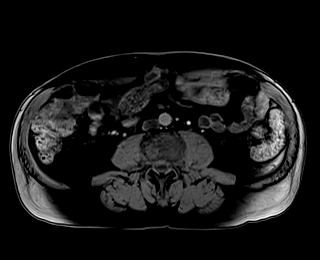
[im 44/88]
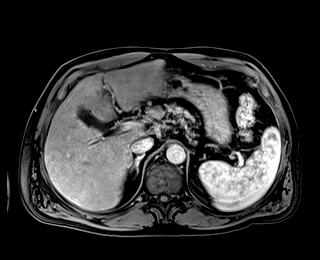
[im 88/88]
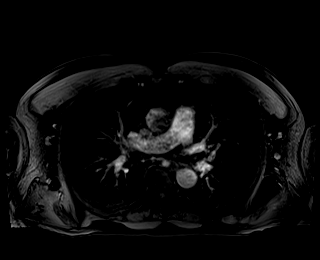

[Series 14: t1_vibe_fs_tra_p4_bh_post_sub · axial · 3.0mm · 1.31mm/px · z∈[-146,+115]mm · 3 of 88 slices shown (1 of 4)]
[im 1/88]
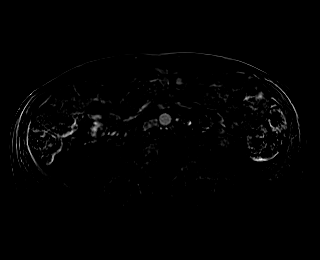
[im 44/88]
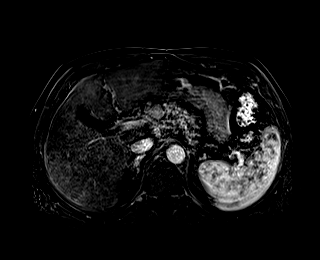
[im 88/88]
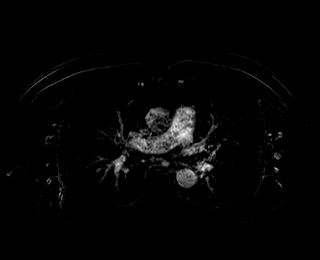

[Series 15: t1_vibe_fs_tra_p4_bh_post · axial · 3.0mm · 1.31mm/px · z∈[-146,+115]mm · 3 of 88 slices shown (2 of 4)]
[im 1/88]
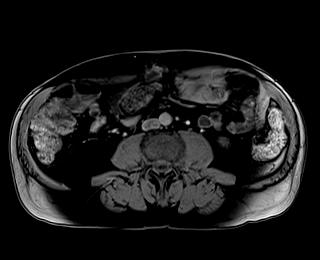
[im 44/88]
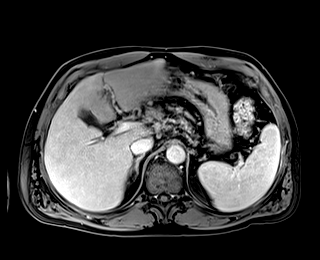
[im 88/88]
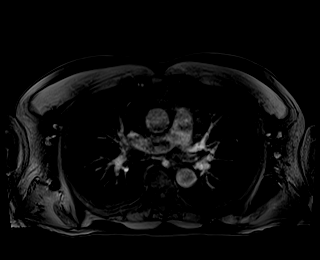

[Series 16: t1_vibe_fs_tra_p4_bh_post_sub · axial · 3.0mm · 1.31mm/px · z∈[-146,+115]mm · 3 of 88 slices shown (2 of 4)]
[im 1/88]
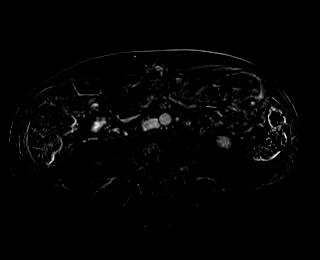
[im 44/88]
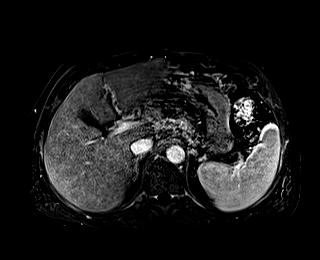
[im 88/88]
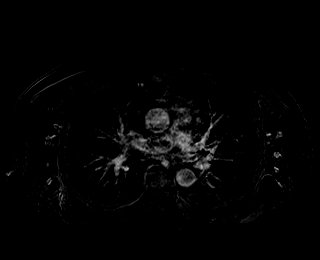

[Series 17: t1_vibe_fs_tra_p4_bh_post · axial · 3.0mm · 1.31mm/px · z∈[-146,+115]mm · 3 of 88 slices shown (3 of 4)]
[im 1/88]
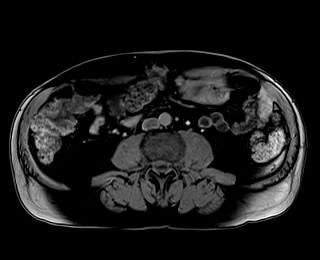
[im 44/88]
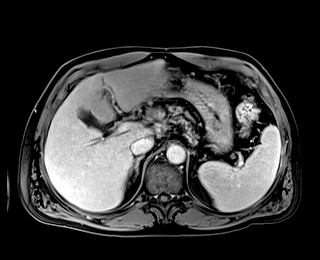
[im 88/88]
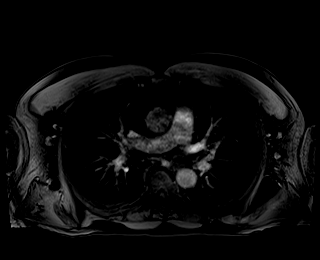

[Series 18: t1_vibe_fs_tra_p4_bh_post_sub · axial · 3.0mm · 1.31mm/px · z∈[-146,+115]mm · 3 of 88 slices shown (3 of 4)]
[im 1/88]
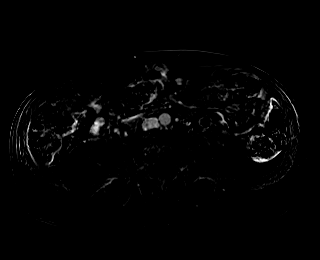
[im 44/88]
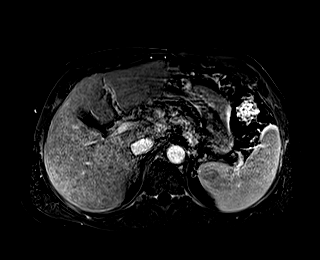
[im 88/88]
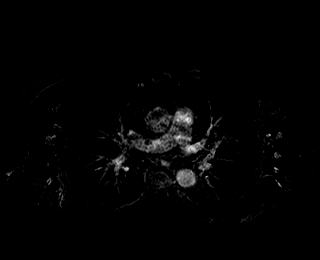

[Series 19: t1_vibe_fs_tra_p4_bh_post · axial · 3.0mm · 1.31mm/px · z∈[-146,+115]mm · 3 of 88 slices shown (4 of 4)]
[im 1/88]
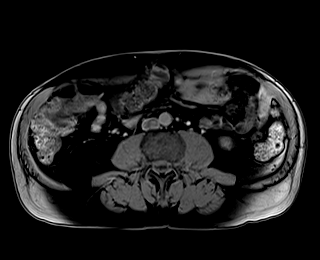
[im 44/88]
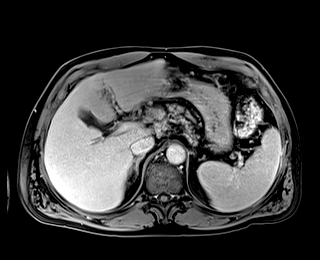
[im 88/88]
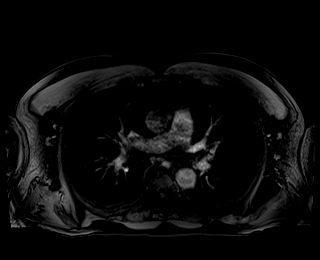

[Series 20: t1_vibe_fs_tra_p4_bh_post_sub · axial · 3.0mm · 1.31mm/px · z∈[-146,+115]mm · 3 of 88 slices shown (4 of 4)]
[im 1/88]
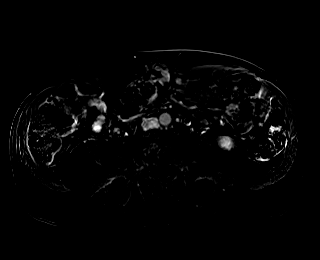
[im 44/88]
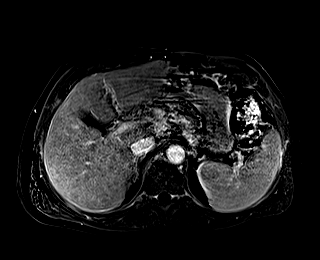
[im 88/88]
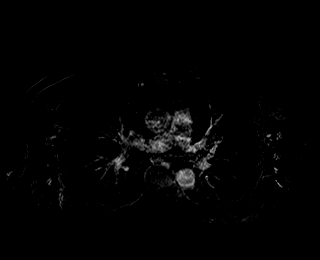

[Series 21: T1 dynamic post-contrast · coronal · 3.0mm · 1.31mm/px · 3 of 88 slices shown]
[im 1/88]
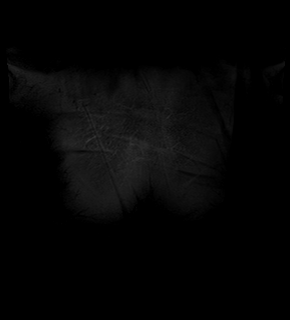
[im 44/88]
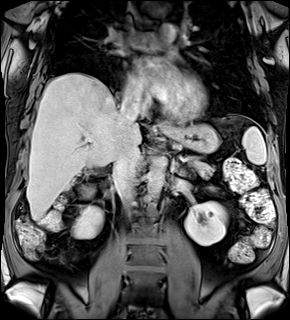
[im 88/88]
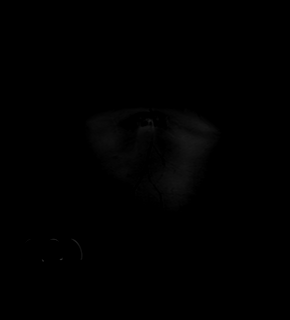

[48 of 48 positions shown; findings below may reference images not displayed]

FINDINGS: Lower chest: Unremarkable.

Hepatobiliary: There is no loss of signal intensity in the hepatic
parenchyma on out of phase dual echo images to suggest hepatic
steatosis. Liver has very mild nodularity of the contour, which
could be indicative of early changes of cirrhosis. No discrete
cystic or solid hepatic lesions. No intra or extrahepatic biliary
ductal dilatation. Gallbladder is normal in appearance.

Pancreas: No pancreatic mass. No pancreatic ductal dilatation. No
pancreatic or peripancreatic fluid or inflammatory changes.

Spleen:  Normal in size and unremarkable in appearance.

Adrenals/Urinary Tract: In the lower pole of the left kidney there
is a subcentimeter T1 hypointense, T2 hyperintense, nonenhancing
lesion, compatible with a tiny simple cyst. Right kidney and
bilateral adrenal glands are normal in appearance. No
hydroureteronephrosis in the visualized portions of the abdomen.

Stomach/Bowel: Visualized portions are unremarkable.

Vascular/Lymphatic: No aneurysm identified in the visualized
abdominal vasculature. Recanalized paraumbilical vein. No
lymphadenopathy noted in the abdomen.

Other: No significant volume of ascites noted in the visualized
portions of the abdomen.

Musculoskeletal: No suspicious osseous lesions are noted in the
visualized portions of the skeleton.
IMPRESSION: 1. Subtle morphologic changes in the liver suggesting early
cirrhosis. There is also a recanalized paraumbilical vein.
2. No evidence of hepatic steatosis.

## 2018-12-24 MED ORDER — GADOBUTROL 1 MMOL/ML IV SOLN
9.0000 mL | Freq: Once | INTRAVENOUS | Status: AC | PRN
  Administered 2018-12-24: 9 mL via INTRAVENOUS

## 2019-01-07 ENCOUNTER — Other Ambulatory Visit: Payer: Self-pay

## 2019-01-07 MED ORDER — SPIRONOLACTONE 100 MG PO TABS: 100.0000 mg | ORAL_TABLET | Freq: Every day | ORAL | 6 refills | Status: DC

## 2019-01-07 MED ORDER — FUROSEMIDE 40 MG PO TABS: 40.0000 mg | ORAL_TABLET | Freq: Every day | ORAL | 6 refills | Status: DC

## 2019-01-09 ENCOUNTER — Other Ambulatory Visit: Payer: 59

## 2019-01-09 ENCOUNTER — Ambulatory Visit: Payer: 59 | Admitting: Internal Medicine

## 2019-01-09 ENCOUNTER — Encounter: Payer: Self-pay | Admitting: Internal Medicine

## 2019-01-09 ENCOUNTER — Ambulatory Visit (INDEPENDENT_AMBULATORY_CARE_PROVIDER_SITE_OTHER): Payer: 59 | Admitting: Internal Medicine

## 2019-01-09 VITALS — BP 120/68 | HR 72 | Ht 68.0 in | Wt 195.5 lb

## 2019-01-09 DIAGNOSIS — K259 Gastric ulcer, unspecified as acute or chronic, without hemorrhage or perforation: Secondary | ICD-10-CM

## 2019-01-09 DIAGNOSIS — K219 Gastro-esophageal reflux disease without esophagitis: Secondary | ICD-10-CM

## 2019-01-09 DIAGNOSIS — K7031 Alcoholic cirrhosis of liver with ascites: Secondary | ICD-10-CM | POA: Diagnosis not present

## 2019-01-09 DIAGNOSIS — B9681 Helicobacter pylori [H. pylori] as the cause of diseases classified elsewhere: Secondary | ICD-10-CM

## 2019-01-09 MED ORDER — LACTULOSE 20 GM/30ML PO SOLN: 30.0000 mL | Freq: Two times a day (BID) | ORAL | 1 refills | Status: DC

## 2019-01-09 NOTE — Progress Notes (Signed)
Subjective:    Patient ID: Christopher Craig, male    DOB: 1961-06-14, 58 y.o.   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  I performed an upper endoscopy since he was last seen in the office on 11/21/2018 which fortunately did not show any evidence of gastroesophageal varices.  There was mild portal gastropathy in the proximal stomach.  There was severe inflammation and shallow ulcers in the antrum and biopsies showed H. pylori.  He completed twice daily PPI and antibiotics.  He has remained on pantoprazole 40 mg once daily.  He is feeling much much better.  He has been completely alcohol abstinent.  He completed H. pylori therapy and is back to pantoprazole 40 mg once daily.  He is continued furosemide 40 mg daily and spironolactone 100 mg daily.  He is on nadolol 40 mg daily.  Lactulose 20 g 1-2 times daily.  He denies abdominal pain.  Resolution of abdominal and lower extremity swelling.  No report of confusion or sleepiness.  No bleeding.  Denies jaundice.  He is back to working out in the gym daily usually about 60 minutes.  He has no plans to resume drinking.  He had an MRI to follow-up on possibly dysplastic nodules in the liver, see below   Review of Systems As per HPI, otherwise negative  Current Medications, Allergies, Past Medical History, Past Surgical History, Family History and Social History were reviewed in [employer] record.     Objective:   Physical Exam BP 120/68   Pulse 72   Ht 5\' 8"  (1.727 m)   Wt 195 lb 8 oz (88.7 kg)   BMI 29.73 kg/m  Constitutional: Well-developed and well-nourished. No distress. HEENT: Normocephalic and atraumatic.  Conjunctivae are normal.  No scleral icterus. Neck: Neck supple. Trachea midline. Cardiovascular: Normal rate, regular rhythm  and intact distal pulses. No M/R/G Pulmonary/chest: Effort normal and breath sounds normal. No wheezing, rales or rhonchi. Abdominal: Soft, nontender, nondistended. Bowel sounds active throughout. There are no masses palpable. No hepatosplenomegaly. Extremities: no clubbing, cyanosis, or edema Neurological: Alert and oriented to person place and time.  No asterixis Skin: Skin is warm and dry. Psychiatric: Normal mood and affect. Behavior is normal.  CMP     Component Value Date/Time   NA 137 12/23/2018 1542   NA 134 09/26/2018 1606   K 3.6 12/23/2018 1542   CL 100 12/23/2018 1542   CO2 28 12/23/2018 1542   GLUCOSE 88 12/23/2018 1542   BUN 12 12/23/2018 1542   BUN 7 09/26/2018 1606   CREATININE 0.80 12/23/2018 1542   CREATININE 0.94 10/09/2014 0839   CALCIUM 9.5 12/23/2018 1542   PROT 7.2 12/23/2018 1542   PROT 6.9 09/26/2018 1606   ALBUMIN 3.6 12/23/2018 1542   ALBUMIN 2.6 (L) 09/26/2018 1606   AST 37 12/23/2018 1542   ALT 29 12/23/2018 1542   ALKPHOS 104 12/23/2018 1542   BILITOT 1.5 (H) 12/23/2018 1542   BILITOT 4.9 (H) 09/26/2018 1606   GFRNONAA >60 09/27/2018 1751   GFRAA >60 09/27/2018 1751   CBC    Component Value Date/Time   WBC 7.8 12/23/2018 1542   RBC 4.11 (L) 12/23/2018 1542   HGB 14.5 12/23/2018 1542   HGB 14.5 09/26/2018 1606   HCT 41.8 12/23/2018 1542   HCT 38.6 09/26/2018 1606  PLT 126.0 (L) 12/23/2018 1542   PLT 114 (L) 09/26/2018 1606   MCV 101.7 (H) 12/23/2018 1542   MCV 100 (H) 09/26/2018 1606   MCH 35.4 (H) 09/27/2018 1751   MCHC 34.8 12/23/2018 1542   RDW 12.3 12/23/2018 1542   RDW 12.2 (L) 09/26/2018 1606   LYMPHSABS 3.2 12/23/2018 1542   LYMPHSABS 1.7 09/26/2018 1606   MONOABS 0.8 12/23/2018 1542   EOSABS 0.2 12/23/2018 1542   EOSABS 0.1 09/26/2018 1606   BASOSABS 0.0 12/23/2018 1542   BASOSABS 0.1 09/26/2018 1606   Lab Results  Component Value Date   INR 1.7 (H) 12/23/2018   INR 1.8 (H) 11/21/2018   INR 1.6 (H) 10/25/2018     58 year old male with history of cirrhosis or fatty liver. No current abdominal complaints. History of alcoholic hepatitis and pancreatitis with leukocytosis.   EXAM: MRI ABDOMEN WITHOUT AND WITH CONTRAST   TECHNIQUE: Multiplanar multisequence MR imaging of the abdomen was performed both before and after the administration of intravenous contrast.   CONTRAST:  9 mL of Gadavist   COMPARISON:  No prior abdominal MRI. CT the abdomen and pelvis 09/27/2018.   FINDINGS: Lower chest: Unremarkable.   Hepatobiliary: There is no loss of signal intensity in the hepatic parenchyma on out of phase dual echo images to suggest hepatic steatosis. Liver has very mild nodularity of the contour, which could be indicative of early changes of cirrhosis. No discrete cystic or solid hepatic lesions. No intra or extrahepatic biliary ductal dilatation. Gallbladder is normal in appearance.   Pancreas: No pancreatic mass. No pancreatic ductal dilatation. No pancreatic or peripancreatic fluid or inflammatory changes.   Spleen:  Normal in size and unremarkable in appearance.   Adrenals/Urinary Tract: In the lower pole of the left kidney there is a subcentimeter T1 hypointense, T2 hyperintense, nonenhancing lesion, compatible with a tiny simple cyst. Right kidney and bilateral adrenal glands are normal in appearance. No hydroureteronephrosis in the visualized portions of the abdomen.   Stomach/Bowel: Visualized portions are unremarkable.   Vascular/Lymphatic: No aneurysm identified in the visualized abdominal vasculature. Recanalized paraumbilical vein. No lymphadenopathy noted in the abdomen.   Other: No significant volume of ascites noted in the visualized portions of the abdomen.   Musculoskeletal: No suspicious osseous lesions are noted in the visualized portions of the skeleton.   IMPRESSION: 1. Subtle morphologic changes in the liver suggesting early cirrhosis. There is also a  recanalized paraumbilical vein. 2. No evidence of hepatic steatosis.     Electronically Signed   By: Vinnie Langton M.D.   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   1.  Alcohol-related cirrhosis/recently decompensated cirrhosis with acute alcoholic hepatitis --he has improved dramatically with alcohol abstinence.  His portal hypertension has also improved as his ascites and lower extremity edema have apparently resolved, this is likely related to alcohol abstinence.  We discussed this at length today but also reiterated that he does have cirrhosis which is a chronic condition that will need management.  His complete alcohol abstinence going forward is paramount for his survival.  He seems to understand this. --Complete alcohol abstinence --Continue low-sodium diet --Decrease Lasix to 20 mg and Aldactone to 50 mg daily.  I asked that he monitor his weight on a daily basis at the gym while he is working out.  If he has more than a 5 pound weight gain that is persistent he is asked to notify me.  I reminded him that as we reduce diuretic dose his ascites or lower extremity swelling may return and if so he should let me know --Continue lactulose 20 g once to twice daily for now; goal of 2-3 soft but formed stools daily --Continue nadolol 40 mg daily which she is tolerating well --HCC screening up-to-date with recent MRI which was reassuring.  No evidence for dysplastic nodule or mass.;  Surveillance ultrasound recommended in August 2020   2.  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 pylori has been treated.  He is not having GERD or gastritis/ulcer symptom.  I am going to have him stop pantoprazole.  Check H. pylori stool antigen in 2 to 3 weeks to confirm eradication.  Follow-up with me in 3  months, sooner if needed

## 2019-01-09 NOTE — Patient Instructions (Addendum)
We have sent the following medications to your pharmacy for you to pick up at your convenience: Lactulose 30 ml 1 to 2 times daily  Decrease lasix to 20 mg daily  Decrease aldactone to 50 mg daily  Discontinue Protonix.  Your provider has requested that you go to the basement level for lab work 2-3 weeks AFTER discontinuing Protonix. Press "B" on the elevator. The lab is located at the first door on the left as you exit the elevator.  Continue Nadolol.  Please follow up with Dr Hilarie Fredrickson in 3 months.  Call our office should you have a 5 pound weight fluctuation over a couple day period.  If you are age 55 or older, your body mass index should be between 23-30. Your Body mass index is 29.73 kg/m. If this is out of the aforementioned range listed, please consider follow up with your Primary Care Provider.  If you are age 2 or younger, your body mass index should be between 19-25. Your Body mass index is 29.73 kg/m. If this is out of the aformentioned range listed, please consider follow up with your Primary Care Provider.

## 2019-01-23 ENCOUNTER — Other Ambulatory Visit (INDEPENDENT_AMBULATORY_CARE_PROVIDER_SITE_OTHER): Payer: 59

## 2019-01-23 DIAGNOSIS — K7460 Unspecified cirrhosis of liver: Secondary | ICD-10-CM

## 2019-01-23 LAB — COMPREHENSIVE METABOLIC PANEL
ALK PHOS: 116 U/L (ref 39–117)
ALT: 19 U/L (ref 0–53)
AST: 24 U/L (ref 0–37)
Albumin: 3.5 g/dL (ref 3.5–5.2)
BUN: 13 mg/dL (ref 6–23)
CALCIUM: 9.5 mg/dL (ref 8.4–10.5)
CO2: 28 mEq/L (ref 19–32)
Chloride: 104 mEq/L (ref 96–112)
Creatinine, Ser: 0.71 mg/dL (ref 0.40–1.50)
GFR: 113.91 mL/min (ref >60.00)
Glucose, Bld: 85 mg/dL (ref 70–99)
Potassium: 4.1 mEq/L (ref 3.5–5.1)
Sodium: 138 mEq/L (ref 135–145)
TOTAL PROTEIN: 6.9 g/dL (ref 6.0–8.3)
Total Bilirubin: 0.8 mg/dL (ref 0.2–1.2)

## 2019-01-23 LAB — CBC WITH DIFFERENTIAL/PLATELET
Basophils Absolute: 0.1 10*3/uL (ref 0.0–0.1)
Basophils Relative: 0.7 % (ref 0.0–3.0)
Eosinophils Absolute: 0.2 10*3/uL (ref 0.0–0.7)
Eosinophils Relative: 2.3 % (ref 0.0–5.0)
HEMATOCRIT: 41.2 % (ref 39.0–52.0)
Hemoglobin: 14.3 g/dL (ref 13.0–17.0)
Lymphocytes Relative: 39 % (ref 12.0–46.0)
Lymphs Abs: 3.4 10*3/uL (ref 0.7–4.0)
MCHC: 34.8 g/dL (ref 30.0–36.0)
MCV: 100.2 fl — ABNORMAL HIGH (ref 78.0–100.0)
Monocytes Absolute: 0.8 10*3/uL (ref 0.1–1.0)
Monocytes Relative: 9.5 % (ref 3.0–12.0)
Neutro Abs: 4.2 10*3/uL (ref 1.4–7.7)
Neutrophils Relative %: 48.5 % (ref 43.0–77.0)
Platelets: 183 10*3/uL (ref 150.0–400.0)
RBC: 4.11 Mil/uL — ABNORMAL LOW (ref 4.22–5.81)
RDW: 13.1 % (ref 11.5–15.5)
WBC: 8.7 10*3/uL (ref 4.0–10.5)

## 2019-01-23 LAB — PROTIME-INR
INR: 1.5 ratio — ABNORMAL HIGH (ref 0.8–1.0)
Prothrombin Time: 17.5 s — ABNORMAL HIGH (ref 9.6–13.1)

## 2019-01-24 ENCOUNTER — Other Ambulatory Visit: Payer: Self-pay

## 2019-01-24 DIAGNOSIS — K7460 Unspecified cirrhosis of liver: Secondary | ICD-10-CM

## 2019-02-03 ENCOUNTER — Other Ambulatory Visit: Payer: 59

## 2019-02-03 DIAGNOSIS — K7031 Alcoholic cirrhosis of liver with ascites: Secondary | ICD-10-CM

## 2019-02-03 DIAGNOSIS — K219 Gastro-esophageal reflux disease without esophagitis: Secondary | ICD-10-CM

## 2019-02-04 LAB — HELICOBACTER PYLORI  SPECIAL ANTIGEN
MICRO NUMBER:: 323403
SPECIMEN QUALITY: ADEQUATE

## 2019-04-25 ENCOUNTER — Other Ambulatory Visit (INDEPENDENT_AMBULATORY_CARE_PROVIDER_SITE_OTHER): Payer: 59

## 2019-04-25 DIAGNOSIS — K7460 Unspecified cirrhosis of liver: Secondary | ICD-10-CM | POA: Diagnosis not present

## 2019-04-25 LAB — CBC WITH DIFFERENTIAL/PLATELET
Basophils Absolute: 0.1 10*3/uL (ref 0.0–0.1)
Basophils Relative: 0.8 % (ref 0.0–3.0)
Eosinophils Absolute: 0.2 10*3/uL (ref 0.0–0.7)
Eosinophils Relative: 2.2 % (ref 0.0–5.0)
HCT: 43 % (ref 39.0–52.0)
Hemoglobin: 15 g/dL (ref 13.0–17.0)
Lymphocytes Relative: 41.5 % (ref 12.0–46.0)
Lymphs Abs: 3.9 10*3/uL (ref 0.7–4.0)
MCHC: 34.9 g/dL (ref 30.0–36.0)
MCV: 96.9 fl (ref 78.0–100.0)
Monocytes Absolute: 0.8 10*3/uL (ref 0.1–1.0)
Monocytes Relative: 8.6 % (ref 3.0–12.0)
Neutro Abs: 4.4 10*3/uL (ref 1.4–7.7)
Neutrophils Relative %: 46.9 % (ref 43.0–77.0)
Platelets: 147 10*3/uL — ABNORMAL LOW (ref 150.0–400.0)
RBC: 4.44 Mil/uL (ref 4.22–5.81)
RDW: 12.8 % (ref 11.5–15.5)
WBC: 9.4 10*3/uL (ref 4.0–10.5)

## 2019-04-25 LAB — COMPREHENSIVE METABOLIC PANEL
ALT: 21 U/L (ref 0–53)
AST: 24 U/L (ref 0–37)
Albumin: 3.9 g/dL (ref 3.5–5.2)
Alkaline Phosphatase: 118 U/L — ABNORMAL HIGH (ref 39–117)
BUN: 8 mg/dL (ref 6–23)
CO2: 24 mEq/L (ref 19–32)
Calcium: 9.3 mg/dL (ref 8.4–10.5)
Chloride: 101 mEq/L (ref 96–112)
Creatinine, Ser: 0.68 mg/dL (ref 0.40–1.50)
GFR: 119.62 mL/min (ref >60.00)
Glucose, Bld: 86 mg/dL (ref 70–99)
Potassium: 4 mEq/L (ref 3.5–5.1)
Sodium: 135 mEq/L (ref 135–145)
Total Bilirubin: 1 mg/dL (ref 0.2–1.2)
Total Protein: 6.9 g/dL (ref 6.0–8.3)

## 2019-04-25 LAB — PROTIME-INR
INR: 1.4 ratio — ABNORMAL HIGH (ref 0.8–1.0)
Prothrombin Time: 15.8 s — ABNORMAL HIGH (ref 9.6–13.1)

## 2019-04-28 ENCOUNTER — Other Ambulatory Visit: Payer: Self-pay

## 2019-04-28 ENCOUNTER — Telehealth: Payer: Self-pay | Admitting: General Surgery

## 2019-04-28 DIAGNOSIS — K7460 Unspecified cirrhosis of liver: Secondary | ICD-10-CM

## 2019-04-28 NOTE — Telephone Encounter (Signed)
Contacted the patient to pre-screen for his televisit with Dr Hilarie Fredrickson on 04/29/2019. The patient was driving back from [facility] and unable to talk. He asked if some one would call back in about 5-6 hours to pre-screen.

## 2019-04-29 ENCOUNTER — Encounter: Payer: Self-pay | Admitting: Internal Medicine

## 2019-04-29 ENCOUNTER — Other Ambulatory Visit: Payer: Self-pay

## 2019-04-29 ENCOUNTER — Ambulatory Visit (INDEPENDENT_AMBULATORY_CARE_PROVIDER_SITE_OTHER): Payer: 59 | Admitting: Internal Medicine

## 2019-04-29 VITALS — BP 128/62 | HR 74 | Temp 98.6°F | Ht 68.0 in | Wt 194.2 lb

## 2019-04-29 DIAGNOSIS — K7030 Alcoholic cirrhosis of liver without ascites: Secondary | ICD-10-CM | POA: Diagnosis not present

## 2019-04-29 MED ORDER — NADOLOL 40 MG PO TABS: ORAL_TABLET | ORAL | 5 refills | Status: DC

## 2019-04-29 NOTE — Progress Notes (Signed)
Subjective:    Patient ID: Christopher Craig, male    DOB: June 06, 1961, 58 y.o.   MRN: [id number]  HPI Christopher Craig is a 58 year old male with a history of alcohol cirrhosis, acute alcoholic hepatitis in late 2019, history of portal hypertension with prior ascites and hepatic encephalopathy, history of H. pylori related ulcer disease who is here for follow-up.  He was last seen in the office on 01/09/2019.  Christopher Craig is doing great.  He has been completely alcohol abstinent and is feeling well.  He has continued low-dose Lasix at 20 mg and spironolactone 50 mg daily.  He has not had lower extremity swelling or abdominal swelling.  Energy levels have been good.  He is itching to get back to the gym though gyms have been shut down with the COVID-19 pandemic.  He is working.  Energy levels are good.  No abdominal pain.  No diarrhea or constipation.  No heartburn.  No confusion.  No bleeding.  He has been on nadolol 40 mg daily and also taking B12 and folate daily.  He is not really using lactulose nor does he feel like he needs it. Review of Systems As per HPI, otherwise negative  Current Medications, Allergies, Past Medical History, Past Surgical History, Family History and Social History were reviewed in [employer] record.     Objective:   Physical Exam Ht 5\' 8"  (1.727 m)   Wt 194 lb 4 oz (88.1 kg)   BMI 29.54 kg/m  Gen: awake, alert, NAD HEENT: anicteric, op clear CV: RRR, no mrg Pulm: CTA b/l Abd: soft, NT/ND, +BS throughout Ext: no c/c/e Neuro: nonfocal, no asterixis  H. pylori stool antigen after completing therapy and off PPI performed on 02/03/2019 --negative  CBC    Component Value Date/Time   WBC 9.4 04/25/2019 1432   RBC 4.44 04/25/2019 1432   HGB 15.0 04/25/2019 1432   HGB 14.5 09/26/2018 1606   HCT 43.0 04/25/2019 1432   HCT 38.6 09/26/2018 1606   PLT 147.0 (L) 04/25/2019 1432   PLT 114 (L) 09/26/2018 1606   MCV 96.9 04/25/2019 1432   MCV 100  (H) 09/26/2018 1606   MCH 35.4 (H) 09/27/2018 1751   MCHC 34.9 04/25/2019 1432   RDW 12.8 04/25/2019 1432   RDW 12.2 (L) 09/26/2018 1606   LYMPHSABS 3.9 04/25/2019 1432   LYMPHSABS 1.7 09/26/2018 1606   MONOABS 0.8 04/25/2019 1432   EOSABS 0.2 04/25/2019 1432   EOSABS 0.1 09/26/2018 1606   BASOSABS 0.1 04/25/2019 1432   BASOSABS 0.1 09/26/2018 1606   CMP     Component Value Date/Time   NA 135 04/25/2019 1432   NA 134 09/26/2018 1606   K 4.0 04/25/2019 1432   CL 101 04/25/2019 1432   CO2 24 04/25/2019 1432   GLUCOSE 86 04/25/2019 1432   BUN 8 04/25/2019 1432   BUN 7 09/26/2018 1606   CREATININE 0.68 04/25/2019 1432   CREATININE 0.94 10/09/2014 0839   CALCIUM 9.3 04/25/2019 1432   PROT 6.9 04/25/2019 1432   PROT 6.9 09/26/2018 1606   ALBUMIN 3.9 04/25/2019 1432   ALBUMIN 2.6 (L) 09/26/2018 1606   AST 24 04/25/2019 1432   ALT 21 04/25/2019 1432   ALKPHOS 118 (H) 04/25/2019 1432   BILITOT 1.0 04/25/2019 1432   BILITOT 4.9 (H) 09/26/2018 1606   GFRNONAA >60 09/27/2018 1751   GFRAA >60 09/27/2018 1751   Lab Results  Component Value Date   INR 1.4 (H) 04/25/2019  INR 1.5 (H) 01/23/2019   INR 1.7 (H) 12/23/2018   MRI ABDOMEN WITHOUT AND WITH CONTRAST   TECHNIQUE: Multiplanar multisequence MR imaging of the abdomen was performed both before and after the administration of intravenous contrast.   CONTRAST:  9 mL of Gadavist   COMPARISON:  No prior abdominal MRI. CT the abdomen and pelvis 09/27/2018.   FINDINGS: Lower chest: Unremarkable.   Hepatobiliary: There is no loss of signal intensity in the hepatic parenchyma on out of phase dual echo images to suggest hepatic steatosis. Liver has very mild nodularity of the contour, which could be indicative of early changes of cirrhosis. No discrete cystic or solid hepatic lesions. No intra or extrahepatic biliary ductal dilatation. Gallbladder is normal in appearance.   Pancreas: No pancreatic mass. No pancreatic  ductal dilatation. No pancreatic or peripancreatic fluid or inflammatory changes.   Spleen:  Normal in size and unremarkable in appearance.   Adrenals/Urinary Tract: In the lower pole of the left kidney there is a subcentimeter T1 hypointense, T2 hyperintense, nonenhancing lesion, compatible with a tiny simple cyst. Right kidney and bilateral adrenal glands are normal in appearance. No hydroureteronephrosis in the visualized portions of the abdomen.   Stomach/Bowel: Visualized portions are unremarkable.   Vascular/Lymphatic: No aneurysm identified in the visualized abdominal vasculature. Recanalized paraumbilical vein. No lymphadenopathy noted in the abdomen.   Other: No significant volume of ascites noted in the visualized portions of the abdomen.   Musculoskeletal: No suspicious osseous lesions are noted in the visualized portions of the skeleton.   IMPRESSION: 1. Subtle morphologic changes in the liver suggesting early cirrhosis. There is also a recanalized paraumbilical vein. 2. No evidence of hepatic steatosis.     Electronically Signed   By: Vinnie Langton M.D.   On: 12/24/2018 20:26       Assessment & Plan:  58 year old male with a history of alcohol cirrhosis, acute alcoholic hepatitis in January 2020, history of portal hypertension with prior ascites and hepatic encephalopathy, history of H. pylori related ulcer disease who is here for follow-up.    1.  Alcohol-related cirrhosis with history of portal hypertension --he is doing wonderfully and his disease is no longer decompensated.  He has been alcohol abstinent which I congratulated him on and also encouraged him to continue his very hard work to never drink again.  No evidence for volume overload or lower extremity edema, or ascites --Continue complete alcohol abstinence --Continue low-sodium diet --Discontinue Lasix and Aldactone; I told him to monitor closely for recurrent lower extremity edema, increasing  abdominal girth or weight increasing more than 5 pounds.  If this occurs he is to notify me immediately --Discontinue lactulose; no evidence of encephalopathy nor has he been using this recently --Continue nadolol 40 mg daily (history of mild portal gastropathy, no varices at screening endoscopy though he was already on beta-blocker) --[place] screening up-to-date with MRI in February 2020, repeat with ultrasound in February 2021 --CBC, CMP and INR all slowly improving which is reassuring; INR remains abnormal at 1.4 but is decreasing. --33-month follow-up with me  2.  H. pylori peptic ulcer disease --he completed H. pylori therapy and follow-up H. pylori stool antigen was negative indicating eradication of infection.  He is now off PPI.  3.  History of adenomatous polyps --surveillance colonoscopy is recommended in January 2021; discuss at follow-up  15 minutes spent with the patient today. Greater than 50% was spent in counseling and coordination of care with the patient

## 2019-04-29 NOTE — Patient Instructions (Addendum)
Discontinue furosemide  Discontinue aldactone  Discontinue lactulose  Continue Nadolol 40 mg daily  Continue folate.  Continue b12.  Watch your weights daily. If your weight increases by 5-10 pounds or if you have lower extremity swelling, please call our office at [phone]. We may need to make some adjustments.  Please follow up with Dr Hilarie Fredrickson in 6 months.  Continue to avoid all alcohol.  Congratulations on your progress! You are doing great!

## 2019-07-15 ENCOUNTER — Other Ambulatory Visit: Payer: Self-pay

## 2019-07-15 ENCOUNTER — Ambulatory Visit (INDEPENDENT_AMBULATORY_CARE_PROVIDER_SITE_OTHER): Payer: 59 | Admitting: Family Medicine

## 2019-07-15 ENCOUNTER — Encounter: Payer: Self-pay | Admitting: Family Medicine

## 2019-07-15 VITALS — BP 142/92 | HR 67 | Temp 98.6°F | Ht 66.5 in | Wt 202.8 lb

## 2019-07-15 DIAGNOSIS — E669 Obesity, unspecified: Secondary | ICD-10-CM | POA: Diagnosis not present

## 2019-07-15 DIAGNOSIS — N529 Male erectile dysfunction, unspecified: Secondary | ICD-10-CM | POA: Diagnosis not present

## 2019-07-15 DIAGNOSIS — Z0000 Encounter for general adult medical examination without abnormal findings: Secondary | ICD-10-CM

## 2019-07-15 DIAGNOSIS — M1990 Unspecified osteoarthritis, unspecified site: Secondary | ICD-10-CM

## 2019-07-15 DIAGNOSIS — I10 Essential (primary) hypertension: Secondary | ICD-10-CM | POA: Diagnosis not present

## 2019-07-15 DIAGNOSIS — Z86010 Personal history of colonic polyps: Secondary | ICD-10-CM | POA: Diagnosis not present

## 2019-07-15 DIAGNOSIS — L309 Dermatitis, unspecified: Secondary | ICD-10-CM | POA: Diagnosis not present

## 2019-07-15 DIAGNOSIS — F1011 Alcohol abuse, in remission: Secondary | ICD-10-CM | POA: Diagnosis not present

## 2019-07-15 MED ORDER — TADALAFIL 20 MG PO TABS: 20.0000 mg | ORAL_TABLET | Freq: Every day | ORAL | 5 refills | Status: DC | PRN

## 2019-07-15 MED ORDER — NADOLOL 40 MG PO TABS: ORAL_TABLET | ORAL | 3 refills | Status: DC

## 2019-07-15 NOTE — Progress Notes (Signed)
   Subjective:    Patient ID: Christopher Craig, male    DOB: Feb 20, 1961, 58 y.o.   MRN: [id number]  HPI He is here for complete examination.  At the end of last year he had difficulty with alcohol related issues including cirrhosis, portal hypertension and ascites.  He is being followed by Dr. Hilarie Fredrickson.  He was last seen by Dr. Hilarie Fredrickson on June 9.  That record was reviewed.  He is off all diuretics now.  He is exercising regularly and eating better.  He is not involved in local counseling but does have a friend that he calls weekly who is a former Management consultant.  He is not involved in the local [facility] organization.  This seems to help him.  He does have colonic polyps and is scheduled for colonoscopy in a couple of years.  He has ED and would like a refill on his Cialis.  He continues on his nadolol for his blood pressure.  He does not complain of any arthritis symptoms.  His work is going very well.  He takes several vitamin preparations.  He uses OTC meds for his eczema and is happy with that.  Otherwise his family and social history as well as health maintenance and immunizations was reviewed.   Review of Systems  All other systems reviewed and are negative.      Objective:   Physical Exam Alert and in no distress. Tympanic membranes and canals are normal. Pharyngeal area is normal. Neck is supple without adenopathy or thyromegaly. Cardiac exam shows a regular sinus rhythm without murmurs or gallops. Lungs are clear to auscultation. Abdominal exam shows no masses or tenderness with normal bowel sounds.  Lateral chest scars are noted.       Assessment & Plan:  Routine general medical examination at a health care facility  Essential hypertension - Plan: nadolol (CORGARD) 40 MG tablet  History of colonic polyps  Adenomatous polyp of colon, unspecified part of colon  Obesity (BMI 30-39.9) - Plan: Lipid panel  Eczema, unspecified type  Arthritis  Erectile dysfunction, unspecified erectile  dysfunction type - Plan: tadalafil (CIALIS) 20 MG tablet  History of alcohol abuse I congratulated him on the hard work that he is done to remain alcohol free and to take good care of himself.  Also discussed stressful situations that might make him start drinking again.  Encouraged him to call me at any point he feels he needs help with maintaining his sobriety.

## 2019-07-16 LAB — LIPID PANEL
Chol/HDL Ratio: 2.3 ratio (ref 0.0–5.0)
Cholesterol, Total: 133 mg/dL (ref 100–199)
HDL: 57 mg/dL (ref >39)
LDL Calculated: 60 mg/dL (ref 0–99)
Triglycerides: 78 mg/dL (ref 0–149)
VLDL Cholesterol Cal: 16 mg/dL (ref 5–40)

## 2019-08-06 ENCOUNTER — Other Ambulatory Visit (INDEPENDENT_AMBULATORY_CARE_PROVIDER_SITE_OTHER): Payer: 59

## 2019-08-06 DIAGNOSIS — K7460 Unspecified cirrhosis of liver: Secondary | ICD-10-CM

## 2019-08-06 LAB — COMPREHENSIVE METABOLIC PANEL
ALT: 20 U/L (ref 0–53)
AST: 25 U/L (ref 0–37)
Albumin: 4 g/dL (ref 3.5–5.2)
Alkaline Phosphatase: 110 U/L (ref 39–117)
BUN: 11 mg/dL (ref 6–23)
CO2: 24 mEq/L (ref 19–32)
Calcium: 9.5 mg/dL (ref 8.4–10.5)
Chloride: 100 mEq/L (ref 96–112)
Creatinine, Ser: 0.74 mg/dL (ref 0.40–1.50)
GFR: 108.39 mL/min (ref >60.00)
Glucose, Bld: 111 mg/dL — ABNORMAL HIGH (ref 70–99)
Potassium: 3.7 mEq/L (ref 3.5–5.1)
Sodium: 133 mEq/L — ABNORMAL LOW (ref 135–145)
Total Bilirubin: 1.2 mg/dL (ref 0.2–1.2)
Total Protein: 7.1 g/dL (ref 6.0–8.3)

## 2019-08-06 LAB — CBC WITH DIFFERENTIAL/PLATELET
Basophils Absolute: 0.1 10*3/uL (ref 0.0–0.1)
Basophils Relative: 0.7 % (ref 0.0–3.0)
Eosinophils Absolute: 0.2 10*3/uL (ref 0.0–0.7)
Eosinophils Relative: 2.2 % (ref 0.0–5.0)
HCT: 44.8 % (ref 39.0–52.0)
Hemoglobin: 15.4 g/dL (ref 13.0–17.0)
Lymphocytes Relative: 35.1 % (ref 12.0–46.0)
Lymphs Abs: 3.6 10*3/uL (ref 0.7–4.0)
MCHC: 34.3 g/dL (ref 30.0–36.0)
MCV: 95.4 fl (ref 78.0–100.0)
Monocytes Absolute: 1 10*3/uL (ref 0.1–1.0)
Monocytes Relative: 9.3 % (ref 3.0–12.0)
Neutro Abs: 5.4 10*3/uL (ref 1.4–7.7)
Neutrophils Relative %: 52.7 % (ref 43.0–77.0)
Platelets: 173 10*3/uL (ref 150.0–400.0)
RBC: 4.7 Mil/uL (ref 4.22–5.81)
RDW: 12.7 % (ref 11.5–15.5)
WBC: 10.2 10*3/uL (ref 4.0–10.5)

## 2019-08-06 LAB — PROTIME-INR
INR: 1.4 ratio — ABNORMAL HIGH (ref 0.8–1.0)
Prothrombin Time: 15.9 s — ABNORMAL HIGH (ref 9.6–13.1)

## 2019-08-08 ENCOUNTER — Encounter: Payer: Self-pay | Admitting: Family Medicine

## 2019-08-08 DIAGNOSIS — N529 Male erectile dysfunction, unspecified: Secondary | ICD-10-CM

## 2019-08-08 MED ORDER — TADALAFIL 20 MG PO TABS: 20.0000 mg | ORAL_TABLET | Freq: Every day | ORAL | 5 refills | Status: DC | PRN

## 2019-08-15 ENCOUNTER — Encounter: Payer: Self-pay | Admitting: Family Medicine

## 2019-10-10 ENCOUNTER — Encounter: Payer: Self-pay | Admitting: Family Medicine

## 2019-10-14 MED ORDER — SILDENAFIL CITRATE 100 MG PO TABS: 100.0000 mg | ORAL_TABLET | Freq: Every day | ORAL | 11 refills | Status: DC | PRN

## 2019-10-15 ENCOUNTER — Telehealth: Payer: Self-pay | Admitting: Family Medicine

## 2019-10-15 NOTE — Telephone Encounter (Signed)
[facility] pharmacy called and wanted to make sure that you were aware that pt had a prescription for Sildenafil and Tadalafil.

## 2019-10-30 ENCOUNTER — Telehealth: Payer: Self-pay

## 2019-10-30 NOTE — Telephone Encounter (Signed)
Faxed over pt PSA and office note . Pt set up is own appt . [place]

## 2019-11-27 ENCOUNTER — Encounter: Payer: Self-pay | Admitting: Family Medicine

## 2020-01-05 DIAGNOSIS — H5203 Hypermetropia, bilateral: Secondary | ICD-10-CM | POA: Diagnosis not present

## 2020-01-05 DIAGNOSIS — H524 Presbyopia: Secondary | ICD-10-CM | POA: Diagnosis not present

## 2020-01-19 ENCOUNTER — Encounter: Payer: Self-pay | Admitting: Family Medicine

## 2020-02-03 ENCOUNTER — Encounter: Payer: Self-pay | Admitting: Family Medicine

## 2020-02-03 MED ORDER — CLOBETASOL PROPIONATE 0.05 % EX CREA: 1.0000 "application " | TOPICAL_CREAM | Freq: Two times a day (BID) | CUTANEOUS | 2 refills | Status: DC

## 2020-04-12 ENCOUNTER — Other Ambulatory Visit: Payer: Self-pay

## 2020-04-12 ENCOUNTER — Telehealth: Payer: Self-pay

## 2020-04-12 DIAGNOSIS — K7030 Alcoholic cirrhosis of liver without ascites: Secondary | ICD-10-CM

## 2020-04-12 NOTE — Telephone Encounter (Signed)
-----   Message from Jerene Bears, MD sent at 04/12/2020 12:11 PM EDT ----- Patient is overdue for [facility] screening Would recommend office follow-up this summer and we can proceed with ultrasound of the abdomen for [facility] screening JMP

## 2020-04-12 NOTE — Telephone Encounter (Signed)
-----   Message from Jerene Bears, MD sent at 04/12/2020 12:11 PM EDT ----- Patient is overdue for [facility] screening Would recommend office follow-up this summer and we can proceed with ultrasound of the abdomen for [facility] screening JMP

## 2020-04-12 NOTE — Telephone Encounter (Signed)
Spoke w/ patient, patient advised of follow up appointment per Dr. Hilarie Fredrickson. Patient only able to come after 3 pm due to work. Scheduled follow up for 07/19/20 at 3:20 pm. Pt aware of Abdominal US scheduled for 07/07/2020 at 10:30 am, with a 10:15 am arrival time. Patient will call if he has any issues or changes.

## 2020-05-18 ENCOUNTER — Ambulatory Visit (INDEPENDENT_AMBULATORY_CARE_PROVIDER_SITE_OTHER): Payer: Self-pay | Admitting: Orthopaedic Surgery

## 2020-05-18 ENCOUNTER — Encounter: Payer: Self-pay | Admitting: Orthopaedic Surgery

## 2020-05-18 ENCOUNTER — Ambulatory Visit (INDEPENDENT_AMBULATORY_CARE_PROVIDER_SITE_OTHER): Payer: Self-pay

## 2020-05-18 DIAGNOSIS — G8929 Other chronic pain: Secondary | ICD-10-CM | POA: Diagnosis not present

## 2020-05-18 DIAGNOSIS — M25511 Pain in right shoulder: Secondary | ICD-10-CM

## 2020-05-18 MED ORDER — LIDOCAINE HCL 1 % IJ SOLN
3.0000 mL | INTRAMUSCULAR | Status: AC | PRN
  Administered 2020-05-18: 3 mL

## 2020-05-18 MED ORDER — METHYLPREDNISOLONE ACETATE 40 MG/ML IJ SUSP
40.0000 mg | INTRAMUSCULAR | Status: AC | PRN
  Administered 2020-05-18: 40 mg via INTRA_ARTICULAR

## 2020-05-18 MED ORDER — BUPIVACAINE HCL 0.5 % IJ SOLN
3.0000 mL | INTRAMUSCULAR | Status: AC | PRN
  Administered 2020-05-18: 3 mL via INTRA_ARTICULAR
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
## 2020-05-31 ENCOUNTER — Encounter: Payer: Self-pay | Admitting: Family Medicine

## 2020-06-01 ENCOUNTER — Encounter: Payer: Self-pay | Admitting: Family Medicine

## 2020-06-15 ENCOUNTER — Telehealth: Payer: Self-pay

## 2020-06-15 NOTE — Telephone Encounter (Signed)
Called pt and he advised he will wait until his next appointment to ask his questions about nadolol. [place]

## 2020-07-07 ENCOUNTER — Ambulatory Visit (HOSPITAL_COMMUNITY): Payer: 59

## 2020-07-07 ENCOUNTER — Ambulatory Visit (HOSPITAL_COMMUNITY)
Admission: RE | Admit: 2020-07-07 | Discharge: 2020-07-07 | Disposition: A | Discharge status: Home / Self Care | Payer: 59 | Source: Ambulatory Visit | Attending: Internal Medicine | Admitting: Internal Medicine

## 2020-07-07 DIAGNOSIS — K828 Other specified diseases of gallbladder: Secondary | ICD-10-CM | POA: Diagnosis not present

## 2020-07-07 DIAGNOSIS — K7030 Alcoholic cirrhosis of liver without ascites: Secondary | ICD-10-CM | POA: Diagnosis not present

## 2020-07-07 DIAGNOSIS — K7689 Other specified diseases of liver: Secondary | ICD-10-CM | POA: Diagnosis not present

## 2020-07-07 DIAGNOSIS — N281 Cyst of kidney, acquired: Secondary | ICD-10-CM | POA: Diagnosis not present

## 2020-07-08 NOTE — Telephone Encounter (Signed)
Let Christopher Craig know that it is standard of care for me to follow patients with cirrhosis even when they are doing good, usually at least once annually. We can reschedule his appt even if this means it is not for several more months to a more convenient time for him Thanks

## 2020-07-14 ENCOUNTER — Ambulatory Visit: Payer: 59 | Admitting: Internal Medicine

## 2020-07-19 ENCOUNTER — Ambulatory Visit (INDEPENDENT_AMBULATORY_CARE_PROVIDER_SITE_OTHER): Payer: 59 | Admitting: Internal Medicine

## 2020-07-19 ENCOUNTER — Encounter: Payer: Self-pay | Admitting: Internal Medicine

## 2020-07-19 ENCOUNTER — Other Ambulatory Visit (INDEPENDENT_AMBULATORY_CARE_PROVIDER_SITE_OTHER): Payer: 59

## 2020-07-19 ENCOUNTER — Other Ambulatory Visit: Payer: Self-pay | Admitting: Internal Medicine

## 2020-07-19 VITALS — BP 170/100 | HR 84 | Ht 66.5 in | Wt 213.5 lb

## 2020-07-19 DIAGNOSIS — F1011 Alcohol abuse, in remission: Secondary | ICD-10-CM | POA: Diagnosis not present

## 2020-07-19 DIAGNOSIS — K7030 Alcoholic cirrhosis of liver without ascites: Secondary | ICD-10-CM

## 2020-07-19 DIAGNOSIS — Z86010 Personal history of colonic polyps: Secondary | ICD-10-CM

## 2020-07-19 DIAGNOSIS — K766 Portal hypertension: Secondary | ICD-10-CM | POA: Diagnosis not present

## 2020-07-19 LAB — COMPREHENSIVE METABOLIC PANEL
ALT: 23 U/L (ref 0–53)
AST: 25 U/L (ref 0–37)
Albumin: 4.1 g/dL (ref 3.5–5.2)
Alkaline Phosphatase: 85 U/L (ref 39–117)
BUN: 11 mg/dL (ref 6–23)
CO2: 25 mEq/L (ref 19–32)
Calcium: 9.4 mg/dL (ref 8.4–10.5)
Chloride: 103 mEq/L (ref 96–112)
Creatinine, Ser: 0.66 mg/dL (ref 0.40–1.50)
GFR: 123.29 mL/min (ref >60.00)
Glucose, Bld: 93 mg/dL (ref 70–99)
Potassium: 3.8 mEq/L (ref 3.5–5.1)
Sodium: 136 mEq/L (ref 135–145)
Total Bilirubin: 1.2 mg/dL (ref 0.2–1.2)
Total Protein: 7.2 g/dL (ref 6.0–8.3)

## 2020-07-19 LAB — CBC WITH DIFFERENTIAL/PLATELET
Basophils Absolute: 0.1 10*3/uL (ref 0.0–0.1)
Basophils Relative: 0.6 % (ref 0.0–3.0)
Eosinophils Absolute: 0.1 10*3/uL (ref 0.0–0.7)
Eosinophils Relative: 0.6 % (ref 0.0–5.0)
HCT: 47.3 % (ref 39.0–52.0)
Hemoglobin: 16.2 g/dL (ref 13.0–17.0)
Lymphocytes Relative: 24.5 % (ref 12.0–46.0)
Lymphs Abs: 2.7 10*3/uL (ref 0.7–4.0)
MCHC: 34.3 g/dL (ref 30.0–36.0)
MCV: 93.1 fl (ref 78.0–100.0)
Monocytes Absolute: 1 10*3/uL (ref 0.1–1.0)
Monocytes Relative: 8.7 % (ref 3.0–12.0)
Neutro Abs: 7.3 10*3/uL (ref 1.4–7.7)
Neutrophils Relative %: 65.6 % (ref 43.0–77.0)
Platelets: 193 10*3/uL (ref 150.0–400.0)
RBC: 5.08 Mil/uL (ref 4.22–5.81)
RDW: 13.1 % (ref 11.5–15.5)
WBC: 11.2 10*3/uL — ABNORMAL HIGH (ref 4.0–10.5)

## 2020-07-19 LAB — PROTIME-INR
INR: 1.3 ratio — ABNORMAL HIGH (ref 0.8–1.0)
Prothrombin Time: 14.7 s — ABNORMAL HIGH (ref 9.6–13.1)

## 2020-07-19 MED ORDER — SUPREP BOWEL PREP KIT 17.5-3.13-1.6 GM/177ML PO SOLN: 1.0000 | ORAL | 0 refills | Status: DC

## 2020-07-19 MED ORDER — NADOLOL 20 MG PO TABS: 20.0000 mg | ORAL_TABLET | Freq: Every day | ORAL | 2 refills | Status: DC

## 2020-07-19 NOTE — Progress Notes (Signed)
Subjective:    Patient ID: Christopher Craig, male    DOB: 1961/10/31, 59 y.o.   MRN: [id number]  HPI Christopher Craig is a 58 year old male with a past medical history of alcohol induced cirrhosis with portal hypertension (prior ascites, hepatic encephalopathy resolved with alcohol abstinence), alcohol abuse in remission, history of H. pylori related ulcer disease status post treatment with confirmed eradication, history of adenomatous colon polyps who is here for follow-up.  He was last seen on 04/29/2019.  He is here alone today.  He reports that he is doing and feeling very well.  He has remained alcohol abstinent now for over 2 years.  He continues to feel very well.  His appetite is good.  He has gained a little bit of weight in his abdomen and he wants to get his weight down slightly.  He is not having issues with lower extremity edema or acute abdominal swelling.  No jaundice.  No blood in stool or melena.  No nausea or vomiting.  Good appetite.  He has avoided nadolol because it has interfered with erectile function.  He was previously taking 40 mg daily.  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   Review of Systems As per HPI, otherwise negative  Current Medications, Allergies, Past Medical History, Past Surgical History, Family History and Social History were reviewed in [employer] record.     Objective:   Physical Exam BP (!) 170/100 (BP Location: Left Arm, Patient Position: Sitting, Cuff Size: Normal)   Pulse 84   Ht 5' 6.5" (1.689 m) Comment: height measured without shoes  Wt 213 lb 8 oz (96.8 kg)   BMI 33.94 kg/m  Gen: awake, alert, NAD HEENT: anicteric, op clear CV: RRR, no mrg Pulm: CTA b/l Abd: soft, NT/ND, +BS throughout Ext: no c/c trace pretibial edema Neuro: nonfocal, no asterixis   CBC, CMP, INR today  ABDOMEN ULTRASOUND COMPLETE   COMPARISON:  Abdominal CT 09/27/2018. Report from abdominal  MRI 12/24/2018, images not available.   FINDINGS: Gallbladder: Physiologically distended. No gallstones or wall thickening visualized. No sonographic Murphy sign noted by sonographer.   Common bile duct: Diameter: 4 mm, normal.   Liver: Coarsened echogenicity with nodular contours. Liver parenchyma is heterogeneous, borderline increased compared to right kidney. No discrete lesion. Portal vein is patent on color Doppler imaging with normal direction of blood flow towards the liver. Recannulized umbilical vein is seen on prior imaging.   IVC: No abnormality visualized.   Pancreas: Visualized portion unremarkable. Distal body and tail are obscured.   Spleen: Size and appearance within normal limits.  No splenomegaly.   Right Kidney: Length: 12.0 cm. Echogenicity within normal limits. No mass or hydronephrosis visualized.   Left Kidney: Length: 12.7 cm. Echogenicity within normal limits. 1.6 cm cyst in the lower kidney. No solid mass or hydronephrosis visualized.   Abdominal aorta: No aneurysm visualized.   Other findings: No intra-abdominal ascites.   IMPRESSION: 1. Cirrhotic liver without evidence of focal lesion. 2. Recannulized umbilical vein consistent with portal hypertension. Normal directional flow in the main portal vein. No splenomegaly. No intra-abdominal ascites. 3. Left renal cyst.     Electronically Signed   By: Keith Rake M.D.   On: 07/07/2020 23:18       Assessment & Plan:  59 year old male with a past medical history of alcohol induced cirrhosis with portal hypertension (prior ascites, hepatic encephalopathy resolved with alcohol abstinence), alcohol abuse in  remission, history of H. pylori related ulcer disease status post treatment with confirmed eradication, history of adenomatous colon polyps who is here for follow-up.  1.  Alcohol-related cirrhosis with history of portal hypertension --he is doing very well and his liver disease is very well  compensated.  He has been alcohol abstinent now for 2+ years which I have congratulated him for and encouraged him to continue.  He seems to be very motivated to never use alcohol again. --Continue alcohol abstinence --We discontinued diuretic therapy and June 2020, he only has trace pretibial edema which does not warrant diuretic therapy at this time.  No evidence for ascites by ultrasound.  Continue low-sodium diet --Nadolol prescribed previously for portal gastropathy which has caused some erectile dysfunction at 40 mg dose.  Will decrease to 20 mg daily.  He agrees to resume nadolol at reduced dose --HCC screening up-to-date with recent ultrasound, see above, repeat in 6 months.  Check AFP today --CBC, CMP and INR today --55-month follow-up  2.  History of adenomatous polyps --greater than 10 adenomas in 2014, a few small adenomas in 2016.  Due for colonoscopy for surveillance at this time.  Colonoscopy recommended and after discussing the risk, benefits and alternatives he is agreeable and wishes to proceed.  He needs to arrange transportation for this appointment.  3.  History of H. pylori peptic ulcer disease --no dyspeptic symptoms.  He remains off PPI.  H. pylori eradication previously confirmed

## 2020-07-19 NOTE — Patient Instructions (Addendum)
You have been scheduled for a colonoscopy. Please follow written instructions given to you at your visit today.  Please pick up your prep supplies at the pharmacy within the next 1-3 days. If you use inhalers (even only as needed), please bring them with you on the day of your procedure.  Your provider has requested that you go to the basement level for lab work before leaving today. Press "B" on the elevator. The lab is located at the first door on the left as you exit the elevator.  Please follow up with Dr Hilarie Fredrickson in 1 year in the office.  Decrease nadolol to 20 mg daily.   If you are age 65 or older, your body mass index should be between 23-30. Your Body mass index is 33.94 kg/m. If this is out of the aforementioned range listed, please consider follow up with your Primary Care Provider.  If you are age 68 or younger, your body mass index should be between 19-25. Your Body mass index is 33.94 kg/m. If this is out of the aformentioned range listed, please consider follow up with your Primary Care Provider.   Due to recent changes in healthcare laws, you may see the results of your imaging and laboratory studies on MyChart before your provider has had a chance to review them.  We understand that in some cases there may be results that are confusing or concerning to you. Not all laboratory results come back in the same time frame and the provider may be waiting for multiple results in order to interpret others.  Please give [place] 48 hours in order for your provider to thoroughly review all the results before contacting the office for clarification of your results.

## 2020-07-20 LAB — AFP TUMOR MARKER: AFP-Tumor Marker: 5.8 ng/mL (ref <6.1)

## 2020-08-02 ENCOUNTER — Encounter: Payer: Self-pay | Admitting: Family Medicine

## 2020-08-03 ENCOUNTER — Encounter: Payer: Self-pay | Admitting: Family Medicine

## 2020-08-03 ENCOUNTER — Ambulatory Visit (INDEPENDENT_AMBULATORY_CARE_PROVIDER_SITE_OTHER): Payer: 59 | Admitting: Family Medicine

## 2020-08-03 ENCOUNTER — Other Ambulatory Visit: Payer: Self-pay

## 2020-08-03 VITALS — BP 166/96 | HR 68 | Temp 98.4°F | Ht 66.5 in | Wt 216.4 lb

## 2020-08-03 DIAGNOSIS — M1990 Unspecified osteoarthritis, unspecified site: Secondary | ICD-10-CM | POA: Diagnosis not present

## 2020-08-03 DIAGNOSIS — Z0000 Encounter for general adult medical examination without abnormal findings: Secondary | ICD-10-CM | POA: Diagnosis not present

## 2020-08-03 DIAGNOSIS — Z86010 Personal history of colonic polyps: Secondary | ICD-10-CM

## 2020-08-03 DIAGNOSIS — K7030 Alcoholic cirrhosis of liver without ascites: Secondary | ICD-10-CM

## 2020-08-03 DIAGNOSIS — N529 Male erectile dysfunction, unspecified: Secondary | ICD-10-CM | POA: Diagnosis not present

## 2020-08-03 DIAGNOSIS — I10 Essential (primary) hypertension: Secondary | ICD-10-CM | POA: Diagnosis not present

## 2020-08-03 DIAGNOSIS — K766 Portal hypertension: Secondary | ICD-10-CM | POA: Diagnosis not present

## 2020-08-03 DIAGNOSIS — E669 Obesity, unspecified: Secondary | ICD-10-CM | POA: Diagnosis not present

## 2020-08-03 NOTE — Progress Notes (Signed)
   Subjective:    Patient ID: Christopher Craig, male    DOB: 1960-12-11, 59 y.o.   MRN: [id number]  HPI He is here for complete examination.  He does have a history of cirrhosis with ascites and encephalopathy.  He has been alcohol free for 2 years and been followed by GI.  Presently he is doing quite nicely.  Recent ultrasound did show cirrhotic liver as well as portal hypertension. Presently he is not dating anybody.  He is taking Chipper Herb well for his hypertension.  He was cut back to 20 mg to see if this would help with some of his underlying ED.  He does have a history of colonic polyps and is scheduled for a repeat colonoscopy soon.  He does have some arthritis and is reluctant to take any medications especially Tylenol due to his liver condition. 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 Review of Systems  All other systems reviewed and are negative.      Objective:   Physical Exam Alert and in no distress. Tympanic membranes and canals are normal. Pharyngeal area is normal. Neck is supple without adenopathy or thyromegaly. Cardiac exam shows a regular sinus rhythm without murmurs or gallops. Lungs are clear to auscultation. Abdominal exam shows no masses or tenderness with normal bowel sounds.      Assessment & Plan:  Routine general medical examination at a health care facility  Essential hypertension  Obesity (BMI 30-39.9)  History of colonic polyps  Arthritis  Erectile dysfunction, unspecified erectile dysfunction type  Portal hypertension (HCC)  Alcoholic cirrhosis of liver without ascites ([place]) He will continue to be followed by GI.  He is to call me in 1 month to let me know how his blood pressure is.  He might need to readjust his medications.  Congratulated him on being alcohol free.  Did discuss the need for him to lose weight just for general health measures.  He will let me know if he needs any ED medications. Also recommend he get the  flu shot at work.

## 2020-08-04 ENCOUNTER — Telehealth: Payer: Self-pay | Admitting: Orthopaedic Surgery

## 2020-08-04 DIAGNOSIS — G8929 Other chronic pain: Secondary | ICD-10-CM

## 2020-08-04 NOTE — Telephone Encounter (Signed)
Patient called advised Dr Lorin Mercy told him to call and set up an MRI on his right shoulder. The number to contact patient is [phone]

## 2020-08-04 NOTE — Telephone Encounter (Signed)
Pt has never seen [facility] before, last ov was with Erlinda Hong.

## 2020-08-04 NOTE — Telephone Encounter (Signed)
noted 

## 2020-08-04 NOTE — Telephone Encounter (Signed)
I have have known him for years. Had injection , shoulder not better wants MRI, he called to see me and could not get an appt for 2 months and saw Dr. Erlinda Hong,  I saw at hospital and he wanted MRI and ROV with me.

## 2020-08-04 NOTE — Telephone Encounter (Signed)
Please advise. Is this something that you have requested?

## 2020-08-04 NOTE — Telephone Encounter (Signed)
Dr. Carlis Stable. Dr. Lorin Mercy wanted to be sure that you are aware he is ordering MRI as you last saw patient.  Sabrina--please see below. Order has been entered. Thanks.

## 2020-08-25 ENCOUNTER — Other Ambulatory Visit: Payer: Self-pay

## 2020-08-25 ENCOUNTER — Ambulatory Visit
Admission: RE | Admit: 2020-08-25 | Discharge: 2020-08-25 | Disposition: A | Discharge status: Home / Self Care | Payer: 59 | Source: Ambulatory Visit | Attending: Orthopaedic Surgery | Admitting: Orthopaedic Surgery

## 2020-08-25 DIAGNOSIS — G8929 Other chronic pain: Secondary | ICD-10-CM

## 2020-08-25 DIAGNOSIS — M75111 Incomplete rotator cuff tear or rupture of right shoulder, not specified as traumatic: Secondary | ICD-10-CM | POA: Diagnosis not present

## 2020-08-26 ENCOUNTER — Encounter: Payer: Self-pay | Admitting: Family Medicine

## 2020-08-27 ENCOUNTER — Encounter: Payer: Self-pay | Admitting: Orthopaedic Surgery

## 2020-08-27 ENCOUNTER — Ambulatory Visit (INDEPENDENT_AMBULATORY_CARE_PROVIDER_SITE_OTHER): Payer: Self-pay | Admitting: Orthopaedic Surgery

## 2020-08-27 DIAGNOSIS — M75111 Incomplete rotator cuff tear or rupture of right shoulder, not specified as traumatic: Secondary | ICD-10-CM
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
## 2020-09-09 ENCOUNTER — Encounter: Payer: Self-pay | Admitting: Family Medicine

## 2020-09-09 ENCOUNTER — Encounter: Payer: Self-pay | Admitting: Internal Medicine

## 2020-09-13 ENCOUNTER — Encounter: Payer: Self-pay | Admitting: Family Medicine

## 2020-09-14 ENCOUNTER — Other Ambulatory Visit: Payer: Self-pay

## 2020-09-14 ENCOUNTER — Ambulatory Visit (AMBULATORY_SURGERY_CENTER): Payer: 59 | Admitting: Internal Medicine

## 2020-09-14 ENCOUNTER — Encounter: Payer: Self-pay | Admitting: Internal Medicine

## 2020-09-14 VITALS — BP 127/77 | HR 63 | Temp 98.1°F | Resp 19 | Ht 66.0 in | Wt 213.0 lb

## 2020-09-14 DIAGNOSIS — D120 Benign neoplasm of cecum: Secondary | ICD-10-CM | POA: Diagnosis not present

## 2020-09-14 DIAGNOSIS — Z86010 Personal history of colonic polyps: Secondary | ICD-10-CM

## 2020-09-14 DIAGNOSIS — D123 Benign neoplasm of transverse colon: Secondary | ICD-10-CM | POA: Diagnosis not present

## 2020-09-14 DIAGNOSIS — Z1211 Encounter for screening for malignant neoplasm of colon: Secondary | ICD-10-CM | POA: Diagnosis not present

## 2020-09-14 DIAGNOSIS — D122 Benign neoplasm of ascending colon: Secondary | ICD-10-CM

## 2020-09-14 HISTORY — PX: COLONOSCOPY: SHX174

## 2020-09-14 MED ORDER — SODIUM CHLORIDE 0.9 % IV SOLN: 500.0000 mL | Freq: Once | INTRAVENOUS | Status: DC

## 2020-09-14 NOTE — Op Note (Signed)
[place] Patient Name: Christopher Craig Procedure Date: 09/14/2020 8:33 AM MRN: [id number] Endoscopist: Jerene Bears , MD Age: 59 Referring MD:  Date of Birth: 06/14/1961 Gender: Male Account #: [id number] Procedure:                Colonoscopy Indications:              High risk colon cancer surveillance: Personal                            history of multiple adenomas (> 10 polyps in 2014,                            2 polyps in 2016), Last colonoscopy: January 2016 Medicines:                Monitored Anesthesia Care Procedure:                Pre-Anesthesia Assessment:                           - Prior to the procedure, a History and Physical                            was performed, and patient medications and                            allergies were reviewed. The patient's tolerance of                            previous anesthesia was also reviewed. The risks                            and benefits of the procedure and the sedation                            options and risks were discussed with the patient.                            All questions were answered, and informed consent                            was obtained. Prior Anticoagulants: The patient has                            taken no previous anticoagulant or antiplatelet                            agents. ASA Grade Assessment: III - A patient with                            severe systemic disease. After reviewing the risks                            and benefits, the patient was deemed in  satisfactory condition to undergo the procedure.                           After obtaining informed consent, the colonoscope                            was passed under direct vision. Throughout the                            procedure, the patient's blood pressure, pulse, and                            oxygen saturations were monitored continuously. The                            Colonoscope  was introduced through the anus and                            advanced to the cecum, identified by appendiceal                            orifice and ileocecal valve. The colonoscopy was                            performed without difficulty. The patient tolerated                            the procedure well. The quality of the bowel                            preparation was excellent. The ileocecal valve,                            appendiceal orifice, and rectum were photographed. Scope In: 8:42:33 AM Scope Out: 8:59:55 AM Scope Withdrawal Time: 0 hours 12 minutes 22 seconds  Total Procedure Duration: 0 hours 17 minutes 22 seconds  Findings:                 The digital rectal exam was normal.                           Two sessile polyps were found in the ascending                            colon and cecum. The polyps were 2 to 3 mm in size.                            These polyps were removed with a cold biopsy                            forceps. Resection and retrieval were complete.                           Two sessile polyps were found in  the transverse                            colon. The polyps were 4 to 6 mm in size. These                            polyps were removed with a cold snare. Resection                            and retrieval were complete.                           Multiple medium-mouthed diverticula were found in                            the descending colon and transverse colon.                           Internal hemorrhoids were found during                            retroflexion. The hemorrhoids were small. Complications:            No immediate complications. Estimated Blood Loss:     Estimated blood loss was minimal. Impression:               - Two 2 to 3 mm polyps in the ascending colon and                            in the cecum, removed with a cold biopsy forceps.                            Resected and retrieved.                           - Two 4  to 6 mm polyps in the transverse colon,                            removed with a cold snare. Resected and retrieved.                           - Diverticulosis in the descending colon and in the                            transverse colon.                           - Small internal hemorrhoids. Recommendation:           - Patient has a contact number available for                            emergencies. The signs and symptoms of potential  delayed complications were discussed with the                            patient. Return to normal activities tomorrow.                            Written discharge instructions were provided to the                            patient.                           - Resume previous diet.                           - Continue present medications.                           - Await pathology results.                           - Repeat colonoscopy is recommended for                            surveillance. The colonoscopy date will be                            determined after pathology results from today's                            exam become available for review. Jerene Bears, MD 09/14/2020 9:06:24 AM This report has been signed electronically.

## 2020-09-14 NOTE — Progress Notes (Signed)
Called to room to assist during endoscopic procedure.  Patient ID and intended procedure confirmed with present staff. Received instructions for my participation in the procedure from the performing physician.  

## 2020-09-14 NOTE — Progress Notes (Signed)
A and O x3. Report to RN. Tolerated MAC anesthesia well.

## 2020-09-14 NOTE — Patient Instructions (Signed)
Handout on polyps, diverticulosis and hemorrhoids given. ? ?YOU HAD AN ENDOSCOPIC PROCEDURE TODAY AT THE [facility]:   Refer to the procedure report that was given to you for any specific questions about what was found during the examination.  If the procedure report does not answer your questions, please call your gastroenterologist to clarify.  If you requested that your care partner not be given the details of your procedure findings, then the procedure report has been included in a sealed envelope for you to review at your convenience later. ? ?YOU SHOULD EXPECT: Some feelings of bloating in the abdomen. Passage of more gas than usual.  Walking can help get rid of the air that was put into your GI tract during the procedure and reduce the bloating. If you had a lower endoscopy (such as a colonoscopy or flexible sigmoidoscopy) you may notice spotting of blood in your stool or on the toilet paper. If you underwent a bowel prep for your procedure, you may not have a normal bowel movement for a few days. ? ?Please Note:  You might notice some irritation and congestion in your nose or some drainage.  This is from the oxygen used during your procedure.  There is no need for concern and it should clear up in a day or so. ? ?SYMPTOMS TO REPORT IMMEDIATELY: ? ?Following lower endoscopy (colonoscopy or flexible sigmoidoscopy): ? Excessive amounts of blood in the stool ? Significant tenderness or worsening of abdominal pains ? Swelling of the abdomen that is new, acute ? Fever of 100?F or higher ? ? ?For urgent or emergent issues, a gastroenterologist can be reached at any hour by calling [phone]. ?Do not use MyChart messaging for urgent concerns.  ? ? ?DIET:  We do recommend a small meal at first, but then you may proceed to your regular diet.  Drink plenty of fluids but you should avoid alcoholic beverages for 24 hours. ? ?ACTIVITY:  You should plan to take it easy for the rest of today and you  should NOT DRIVE or use heavy machinery until tomorrow (because of the sedation medicines used during the test).   ? ?FOLLOW UP: ?Our staff will call the number listed on your records 48-72 hours following your procedure to check on you and address any questions or concerns that you may have regarding the information given to you following your procedure. If we do not reach you, we will leave a message.  We will attempt to reach you two times.  During this call, we will ask if you have developed any symptoms of COVID 19. If you develop any symptoms (ie: fever, flu-like symptoms, shortness of breath, cough etc.) before then, please call [phone].  If you test positive for Covid 19 in the 2 weeks post procedure, please call and report this information to us.   ? ?If any biopsies were taken you will be contacted by phone or by letter within the next 1-3 weeks.  Please call us at [phone] if you have not heard about the biopsies in 3 weeks.  ? ? ?SIGNATURES/CONFIDENTIALITY: ?You and/or your care partner have signed paperwork which will be entered into your electronic medical record.  These signatures attest to the fact that that the information above on your After Visit Summary has been reviewed and is understood.  Full responsibility of the confidentiality of this discharge information lies with you and/or your care-partner.  ?

## 2020-09-15 ENCOUNTER — Encounter: Payer: Self-pay | Admitting: Family Medicine

## 2020-09-16 ENCOUNTER — Telehealth: Payer: Self-pay

## 2020-09-16 ENCOUNTER — Telehealth: Payer: Self-pay | Admitting: *Deleted

## 2020-09-16 NOTE — Telephone Encounter (Signed)
  Follow up Call-  Call back number 09/14/2020 11/21/2018  Post procedure Call Back phone  # [phone]  Permission to leave phone message Yes Yes  Some recent data might be hidden     Patient questions:  Message left to call us if necessary.

## 2020-09-16 NOTE — Telephone Encounter (Signed)
  Follow up Call-  Call back number 09/14/2020 11/21/2018  Post procedure Call Back phone  # [phone]  Permission to leave phone message Yes Yes  Some recent data might be hidden     Patient questions:  Do you have a fever, pain , or abdominal swelling? No. Pain Score  0 *  Have you tolerated food without any problems? Yes.    Have you been able to return to your normal activities? Yes.    Do you have any questions about your discharge instructions: Diet   No. Medications  No. Follow up visit  No.  Do you have questions or concerns about your Care? No.  Actions: * If pain score is 4 or above: No action needed, pain <4.  1. Have you developed a fever since your procedure? no  2.   Have you had an respiratory symptoms (SOB or cough) since your procedure? no  3.   Have you tested positive for COVID 19 since your procedure no  4.   Have you had any family members/close contacts diagnosed with the COVID 19 since your procedure?  no   If yes to any of these questions please route to Joylene John, RN and Joella Prince, RN

## 2020-09-21 ENCOUNTER — Encounter: Payer: Self-pay | Admitting: Internal Medicine

## 2020-10-01 ENCOUNTER — Encounter: Payer: 59 | Admitting: Family Medicine

## 2020-10-19 ENCOUNTER — Other Ambulatory Visit: Payer: Self-pay | Admitting: Family Medicine

## 2020-10-19 ENCOUNTER — Encounter: Payer: Self-pay | Admitting: Family Medicine

## 2020-10-19 MED ORDER — SILDENAFIL CITRATE 100 MG PO TABS: 100.0000 mg | ORAL_TABLET | Freq: Every day | ORAL | 11 refills | Status: DC | PRN

## 2020-11-11 ENCOUNTER — Other Ambulatory Visit: Payer: Self-pay | Admitting: Internal Medicine

## 2020-11-11 DIAGNOSIS — K766 Portal hypertension: Secondary | ICD-10-CM

## 2020-11-24 ENCOUNTER — Other Ambulatory Visit (HOSPITAL_COMMUNITY): Payer: Self-pay | Admitting: Internal Medicine

## 2020-12-15 ENCOUNTER — Other Ambulatory Visit: Payer: Self-pay

## 2020-12-15 ENCOUNTER — Encounter: Payer: Self-pay | Admitting: Family Medicine

## 2021-02-14 ENCOUNTER — Other Ambulatory Visit: Payer: Self-pay | Admitting: Family Medicine

## 2021-02-14 DIAGNOSIS — K766 Portal hypertension: Secondary | ICD-10-CM

## 2021-02-15 ENCOUNTER — Encounter: Payer: Self-pay | Admitting: Family Medicine

## 2021-02-18 ENCOUNTER — Encounter: Payer: Self-pay | Admitting: Family Medicine

## 2021-02-22 ENCOUNTER — Other Ambulatory Visit (HOSPITAL_COMMUNITY): Payer: Self-pay

## 2021-02-22 MED FILL — Sildenafil Citrate Tab 100 MG: ORAL | 30 days supply | Qty: 5 | Fill #0 | Status: AC

## 2021-02-23 ENCOUNTER — Other Ambulatory Visit (HOSPITAL_COMMUNITY): Payer: Self-pay

## 2021-03-08 ENCOUNTER — Encounter: Payer: Self-pay | Admitting: Internal Medicine

## 2021-03-11 ENCOUNTER — Other Ambulatory Visit (HOSPITAL_COMMUNITY): Payer: Self-pay

## 2021-03-21 ENCOUNTER — Other Ambulatory Visit (HOSPITAL_COMMUNITY): Payer: Self-pay

## 2021-03-21 MED FILL — Sildenafil Citrate Tab 100 MG: ORAL | 30 days supply | Qty: 5 | Fill #1 | Status: AC

## 2021-03-30 DIAGNOSIS — H5203 Hypermetropia, bilateral: Secondary | ICD-10-CM | POA: Diagnosis not present

## 2021-03-30 DIAGNOSIS — H524 Presbyopia: Secondary | ICD-10-CM | POA: Diagnosis not present

## 2021-03-30 DIAGNOSIS — H52223 Regular astigmatism, bilateral: Secondary | ICD-10-CM | POA: Diagnosis not present

## 2021-04-25 ENCOUNTER — Other Ambulatory Visit (HOSPITAL_COMMUNITY): Payer: Self-pay

## 2021-04-25 MED FILL — Sildenafil Citrate Tab 100 MG: ORAL | 30 days supply | Qty: 5 | Fill #2 | Status: AC

## 2021-05-16 ENCOUNTER — Other Ambulatory Visit (HOSPITAL_COMMUNITY): Payer: Self-pay

## 2021-05-16 ENCOUNTER — Encounter: Payer: Self-pay | Admitting: Family Medicine

## 2021-05-16 ENCOUNTER — Other Ambulatory Visit: Payer: Self-pay | Admitting: Family Medicine

## 2021-05-16 DIAGNOSIS — K766 Portal hypertension: Secondary | ICD-10-CM

## 2021-05-16 MED ORDER — NADOLOL 20 MG PO TABS
20.0000 mg | ORAL_TABLET | Freq: Every morning | ORAL | 1 refills | Status: DC
  Filled 2021-05-16: qty 90, 90d supply, fill #0

## 2021-05-16 MED ORDER — NADOLOL 20 MG PO TABS
20.0000 mg | ORAL_TABLET | Freq: Every morning | ORAL | 0 refills | Status: DC
  Filled 2021-05-16: qty 90, 90d supply, fill #0

## 2021-05-17 ENCOUNTER — Other Ambulatory Visit (HOSPITAL_COMMUNITY): Payer: Self-pay

## 2021-08-04 ENCOUNTER — Ambulatory Visit (INDEPENDENT_AMBULATORY_CARE_PROVIDER_SITE_OTHER): Payer: 59 | Admitting: Family Medicine

## 2021-08-04 ENCOUNTER — Encounter: Payer: Self-pay | Admitting: Family Medicine

## 2021-08-04 ENCOUNTER — Other Ambulatory Visit: Payer: Self-pay

## 2021-08-04 ENCOUNTER — Other Ambulatory Visit (HOSPITAL_COMMUNITY): Payer: Self-pay

## 2021-08-04 VITALS — BP 168/98 | HR 65 | Temp 98.4°F | Ht 66.5 in | Wt 224.6 lb

## 2021-08-04 DIAGNOSIS — Z860100 Personal history of colon polyps, unspecified: Secondary | ICD-10-CM

## 2021-08-04 DIAGNOSIS — K7030 Alcoholic cirrhosis of liver without ascites: Secondary | ICD-10-CM

## 2021-08-04 DIAGNOSIS — I10 Essential (primary) hypertension: Secondary | ICD-10-CM

## 2021-08-04 DIAGNOSIS — Z860101 Personal history of adenomatous and serrated colon polyps: Secondary | ICD-10-CM

## 2021-08-04 DIAGNOSIS — E669 Obesity, unspecified: Secondary | ICD-10-CM | POA: Diagnosis not present

## 2021-08-04 DIAGNOSIS — B360 Pityriasis versicolor: Secondary | ICD-10-CM | POA: Diagnosis not present

## 2021-08-04 DIAGNOSIS — K766 Portal hypertension: Secondary | ICD-10-CM

## 2021-08-04 DIAGNOSIS — Z0000 Encounter for general adult medical examination without abnormal findings: Secondary | ICD-10-CM | POA: Diagnosis not present

## 2021-08-04 LAB — POCT URINALYSIS DIP (PROADVANTAGE DEVICE)
Bilirubin, UA: NEGATIVE
Blood, UA: NEGATIVE
Glucose, UA: NEGATIVE mg/dL
Ketones, POC UA: NEGATIVE mg/dL
Leukocytes, UA: NEGATIVE
Nitrite, UA: NEGATIVE
Protein Ur, POC: NEGATIVE mg/dL
Specific Gravity, Urine: 1
Urobilinogen, Ur: NEGATIVE
pH, UA: 7 (ref 5.0–8.0)

## 2021-08-04 MED ORDER — NADOLOL 20 MG PO TABS
20.0000 mg | ORAL_TABLET | Freq: Every morning | ORAL | 1 refills | Status: DC
  Filled 2021-08-04: qty 90, 90d supply, fill #0

## 2021-08-04 NOTE — Patient Instructions (Addendum)
45 minutes daily of exerciseTinea Versicolor Tinea versicolor is a skin infection. It is caused by a type of yeast. It is normal for some yeast to be on your skin, but too much yeast causes this infection. The infection causes a rash of light or dark patches on your skin. The rash is most common on the chest, back, neck, or upper arms. The infection usually does not cause other problems. If it is treated, it will probably go away in a few weeks. The infection cannot be spread from one person to another (is notcontagious). What are the causes? This condition is caused by a certain type of yeast that starts to grow too much on your skin. What increases the risk? Heat and humidity. Sweating too much. Hormone changes. This may happen when taking birth control pills. Oily skin. A weak disease-fighting system (immunesystem). What are the signs or symptoms? A rash of light or dark patches on your skin. The rash may have: Patches of tan or pink spots (on light skin). Patches of white or brown spots (on dark skin). Patches of skin that do not tan. Well-marked edges. Scales. Mild itching. There may also be no itching. How is this treated? Treatment for this condition may include: Dandruff shampoo. The shampoo may be used on the affected skin during showers or baths. Over-the-counter medicated skin cream, lotion, or soaps. Prescription antifungal medicine. This may include cream or pills. Medicine to help your itching. Follow these instructions at home: Use over-the-counter and prescription medicines only as told by your doctor. Wash your skin with dandruff shampoo as told by your doctor. Do not scratch your skin in the rash area. Avoid places that are hot and humid. Do not use tanning booths. Try to avoid sweating a lot. Contact a doctor if: Your symptoms get worse. You have a fever. You have signs of infection such as: Redness, swelling, or pain in the rash area. Warmth coming from your  rash. Fluid or blood coming from your rash. Pus or a bad smell coming from your rash. Your rash comes back (recurs) after treatment. Your rash does not improve with treatment. Your rash spreads to other parts of the body. Summary Tinea versicolor is a skin infection. It causes a rash of light or dark patches on your skin. The rash is most common on the chest, back, neck, or upper arms. This infection usually does not cause other problems. Use over-the-counter and prescription medicines only as told by your doctor. If the infection is treated, it will probably go away in a few weeks. This information is not intended to replace advice given to you by your health care provider. Make sure you discuss any questions you have with your health care provider. Document Revised: 01/25/2021 Document Reviewed: 01/25/2021 Elsevier Patient Education  [place].

## 2021-08-04 NOTE — Progress Notes (Signed)
   Subjective:    Patient ID: Christopher Craig, male    DOB: Sep 23, 1961, 60 y.o.   MRN: [id number]  HPI He is here for complete examination.  He does have underlying portal hypertension as well as cirrhosis and is followed by Dr. Hilarie Fredrickson.  He has been alcohol free now for 3 years.  He also has a history of tubular adenoma.  He has gained weight recently recognizes the need to increase his physical activity.  He does have hypopigmented changes noted on the anterior chest and arms.  He has concerns about that.  Continues on Corgard.  Does take omega-3 fatty acids and a multivitamin.  Work is going well.   Review of Systems  All other systems reviewed and are negative.     Objective:   Physical Exam Alert and in no distress. Tympanic membranes and canals are normal. Pharyngeal area is normal. Neck is supple without adenopathy or thyromegaly. Cardiac exam shows a regular sinus rhythm without murmurs or gallops. Lungs are clear to auscultation. Abdominal exam shows no masses or tenderness.       Assessment & Plan:  Routine general medical examination at a health care facility - Plan: CBC with Differential/Platelet, Lipid panel, POCT Urinalysis DIP (Proadvantage Device)  Portal hypertension (HCC) - Plan: nadolol (CORGARD) 20 MG tablet  History of colonic polyps  Essential hypertension - Plan: CBC with Differential/Platelet, Comprehensive metabolic panel  Obesity (BMI 30-39.9)  Tinea versicolor  Hx of adenomatous colonic polyps  Alcoholic cirrhosis of liver without ascites ([place])  Primary hypertension He is to check his blood pressure weekly and send me the results through [place].  Might need to have his blood pressure medication readjusted. He will continue to be followed by gastroenterology. Discussed treatment of tinea versicolor with antifungal medications and also possibly using Selsun Blue. Encouraged him to increase his physical activity to 45 minutes daily

## 2021-08-05 LAB — COMPREHENSIVE METABOLIC PANEL
ALT: 25 IU/L (ref 0–44)
AST: 25 IU/L (ref 0–40)
Albumin/Globulin Ratio: 1.6 (ref 1.2–2.2)
Albumin: 4.4 g/dL (ref 3.8–4.9)
Alkaline Phosphatase: 101 IU/L (ref 44–121)
BUN/Creatinine Ratio: 11 (ref 10–24)
BUN: 8 mg/dL (ref 8–27)
Bilirubin Total: 0.5 mg/dL (ref 0.0–1.2)
CO2: 23 mmol/L (ref 20–29)
Calcium: 9.4 mg/dL (ref 8.6–10.2)
Chloride: 102 mmol/L (ref 96–106)
Creatinine, Ser: 0.74 mg/dL — ABNORMAL LOW (ref 0.76–1.27)
Globulin, Total: 2.7 g/dL (ref 1.5–4.5)
Glucose: 91 mg/dL (ref 65–99)
Potassium: 4.2 mmol/L (ref 3.5–5.2)
Sodium: 139 mmol/L (ref 134–144)
Total Protein: 7.1 g/dL (ref 6.0–8.5)
eGFR: 104 mL/min/{1.73_m2} (ref >59)

## 2021-08-05 LAB — LIPID PANEL
Chol/HDL Ratio: 3.1 ratio (ref 0.0–5.0)
Cholesterol, Total: 122 mg/dL (ref 100–199)
HDL: 39 mg/dL — ABNORMAL LOW (ref >39)
LDL Chol Calc (NIH): 70 mg/dL (ref 0–99)
Triglycerides: 62 mg/dL (ref 0–149)
VLDL Cholesterol Cal: 13 mg/dL (ref 5–40)

## 2021-08-05 LAB — CBC WITH DIFFERENTIAL/PLATELET
Basophils Absolute: 0.1 10*3/uL (ref 0.0–0.2)
Basos: 1 %
EOS (ABSOLUTE): 0.2 10*3/uL (ref 0.0–0.4)
Eos: 2 %
Hematocrit: 46.6 % (ref 37.5–51.0)
Hemoglobin: 16.3 g/dL (ref 13.0–17.7)
Immature Grans (Abs): 0 10*3/uL (ref 0.0–0.1)
Immature Granulocytes: 0 %
Lymphocytes Absolute: 3.5 10*3/uL — ABNORMAL HIGH (ref 0.7–3.1)
Lymphs: 39 %
MCH: 31.6 pg (ref 26.6–33.0)
MCHC: 35 g/dL (ref 31.5–35.7)
MCV: 90 fL (ref 79–97)
Monocytes Absolute: 0.8 10*3/uL (ref 0.1–0.9)
Monocytes: 9 %
Neutrophils Absolute: 4.4 10*3/uL (ref 1.4–7.0)
Neutrophils: 49 %
Platelets: 190 10*3/uL (ref 150–450)
RBC: 5.16 x10E6/uL (ref 4.14–5.80)
RDW: 12.1 % (ref 11.6–15.4)
WBC: 8.9 10*3/uL (ref 3.4–10.8)

## 2021-08-18 ENCOUNTER — Encounter: Payer: Self-pay | Admitting: Family Medicine

## 2021-09-01 ENCOUNTER — Encounter: Payer: Self-pay | Admitting: Family Medicine

## 2021-09-28 ENCOUNTER — Other Ambulatory Visit: Payer: Self-pay | Admitting: Family Medicine

## 2021-09-28 ENCOUNTER — Other Ambulatory Visit (HOSPITAL_COMMUNITY): Payer: Self-pay

## 2021-09-28 DIAGNOSIS — K766 Portal hypertension: Secondary | ICD-10-CM

## 2021-09-28 MED ORDER — NADOLOL 20 MG PO TABS
20.0000 mg | ORAL_TABLET | Freq: Every morning | ORAL | 1 refills | Status: DC
  Filled 2021-09-28: qty 90, 90d supply, fill #0

## 2021-09-29 ENCOUNTER — Other Ambulatory Visit (HOSPITAL_COMMUNITY): Payer: Self-pay

## 2021-09-29 MED ORDER — NADOLOL 40 MG PO TABS
40.0000 mg | ORAL_TABLET | Freq: Every day | ORAL | 3 refills | Status: DC
  Filled 2021-09-29: qty 90, 90d supply, fill #0

## 2021-09-30 ENCOUNTER — Other Ambulatory Visit (HOSPITAL_COMMUNITY): Payer: Self-pay

## 2021-09-30 ENCOUNTER — Encounter: Payer: Self-pay | Admitting: Family Medicine

## 2021-09-30 DIAGNOSIS — I10 Essential (primary) hypertension: Secondary | ICD-10-CM

## 2021-09-30 MED ORDER — LOSARTAN POTASSIUM 50 MG PO TABS
50.0000 mg | ORAL_TABLET | Freq: Every day | ORAL | 3 refills | Status: DC
  Filled 2021-09-30: qty 90, 90d supply, fill #0
  Filled 2022-01-01: qty 90, 90d supply, fill #1
  Filled 2022-03-29: qty 90, 90d supply, fill #2

## 2021-10-03 ENCOUNTER — Other Ambulatory Visit (HOSPITAL_COMMUNITY): Payer: Self-pay

## 2021-11-08 ENCOUNTER — Other Ambulatory Visit (HOSPITAL_COMMUNITY): Payer: Self-pay

## 2021-11-08 ENCOUNTER — Encounter: Payer: Self-pay | Admitting: Family Medicine

## 2021-11-08 MED ORDER — TADALAFIL 20 MG PO TABS
20.0000 mg | ORAL_TABLET | Freq: Every day | ORAL | 5 refills | Status: DC | PRN
  Filled 2021-11-08: qty 6, 30d supply, fill #0
  Filled 2021-12-09: qty 6, 30d supply, fill #1
  Filled 2022-01-11: qty 6, 30d supply, fill #2
  Filled 2022-02-08: qty 6, 30d supply, fill #3
  Filled 2022-03-13: qty 6, 30d supply, fill #4
  Filled 2022-04-11: qty 6, 30d supply, fill #5
  Filled 2022-05-10: qty 6, 30d supply, fill #6
  Filled 2022-06-07: qty 6, 30d supply, fill #7
  Filled 2022-07-03: qty 1, 6d supply, fill #8
  Filled 2022-07-03: qty 5, 24d supply, fill #8
  Filled 2022-08-07: qty 6, 30d supply, fill #9

## 2021-11-17 ENCOUNTER — Other Ambulatory Visit (HOSPITAL_COMMUNITY): Payer: Self-pay

## 2021-11-17 MED ORDER — AMOXICILLIN 500 MG PO CAPS
500.0000 mg | ORAL_CAPSULE | Freq: Two times a day (BID) | ORAL | 0 refills | Status: DC
  Filled 2021-11-17: qty 14, 7d supply, fill #0

## 2021-12-01 ENCOUNTER — Other Ambulatory Visit (HOSPITAL_COMMUNITY): Payer: Self-pay

## 2021-12-01 MED ORDER — AMOXICILLIN 500 MG PO CAPS
500.0000 mg | ORAL_CAPSULE | Freq: Four times a day (QID) | ORAL | 0 refills | Status: DC
  Filled 2021-12-01: qty 25, 7d supply, fill #0

## 2021-12-05 DIAGNOSIS — N529 Male erectile dysfunction, unspecified: Secondary | ICD-10-CM | POA: Diagnosis not present

## 2021-12-06 ENCOUNTER — Encounter: Payer: Self-pay | Admitting: Family Medicine

## 2021-12-06 DIAGNOSIS — N529 Male erectile dysfunction, unspecified: Secondary | ICD-10-CM | POA: Diagnosis not present

## 2021-12-06 DIAGNOSIS — Z125 Encounter for screening for malignant neoplasm of prostate: Secondary | ICD-10-CM | POA: Diagnosis not present

## 2021-12-09 ENCOUNTER — Other Ambulatory Visit (HOSPITAL_COMMUNITY): Payer: Self-pay

## 2022-01-02 ENCOUNTER — Telehealth: Payer: Self-pay | Admitting: Internal Medicine

## 2022-01-02 ENCOUNTER — Other Ambulatory Visit (HOSPITAL_COMMUNITY): Payer: Self-pay

## 2022-01-02 NOTE — Telephone Encounter (Signed)
Patient called states he no longer can see Dr. Vena Rua name in his MyChart seeking advise.

## 2022-01-03 ENCOUNTER — Encounter: Payer: Self-pay | Admitting: Family Medicine

## 2022-01-03 NOTE — Telephone Encounter (Signed)
Dr. Hilarie Fredrickson has been added to the careteam.

## 2022-01-10 ENCOUNTER — Encounter: Payer: Self-pay | Admitting: Internal Medicine

## 2022-01-11 ENCOUNTER — Other Ambulatory Visit (HOSPITAL_COMMUNITY): Payer: Self-pay

## 2022-02-08 ENCOUNTER — Other Ambulatory Visit (HOSPITAL_COMMUNITY): Payer: Self-pay

## 2022-03-09 ENCOUNTER — Encounter: Payer: Self-pay | Admitting: Family Medicine

## 2022-03-13 ENCOUNTER — Other Ambulatory Visit (HOSPITAL_COMMUNITY): Payer: Self-pay

## 2022-03-30 ENCOUNTER — Other Ambulatory Visit (HOSPITAL_COMMUNITY): Payer: Self-pay

## 2022-04-10 ENCOUNTER — Telehealth: Payer: Self-pay

## 2022-04-10 ENCOUNTER — Encounter: Payer: Self-pay | Admitting: Family Medicine

## 2022-04-10 NOTE — Telephone Encounter (Signed)
B/p med was added to chart per pt . [place]

## 2022-04-11 ENCOUNTER — Other Ambulatory Visit (HOSPITAL_COMMUNITY): Payer: Self-pay

## 2022-05-10 ENCOUNTER — Other Ambulatory Visit (HOSPITAL_COMMUNITY): Payer: Self-pay

## 2022-06-07 ENCOUNTER — Other Ambulatory Visit (HOSPITAL_COMMUNITY): Payer: Self-pay

## 2022-06-08 ENCOUNTER — Ambulatory Visit: Payer: 59 | Admitting: Internal Medicine

## 2022-06-16 ENCOUNTER — Encounter: Payer: Self-pay | Admitting: Internal Medicine

## 2022-07-03 ENCOUNTER — Other Ambulatory Visit (HOSPITAL_COMMUNITY): Payer: Self-pay

## 2022-07-03 ENCOUNTER — Other Ambulatory Visit: Payer: Self-pay | Admitting: Family Medicine

## 2022-07-03 MED ORDER — NADOLOL 40 MG PO TABS
40.0000 mg | ORAL_TABLET | Freq: Every day | ORAL | 0 refills | Status: DC
  Filled 2022-07-03: qty 90, 90d supply, fill #0

## 2022-07-04 ENCOUNTER — Other Ambulatory Visit (HOSPITAL_COMMUNITY): Payer: Self-pay

## 2022-07-06 ENCOUNTER — Other Ambulatory Visit (HOSPITAL_COMMUNITY): Payer: Self-pay

## 2022-07-06 DIAGNOSIS — M9903 Segmental and somatic dysfunction of lumbar region: Secondary | ICD-10-CM | POA: Diagnosis not present

## 2022-07-06 DIAGNOSIS — M9902 Segmental and somatic dysfunction of thoracic region: Secondary | ICD-10-CM | POA: Diagnosis not present

## 2022-07-06 DIAGNOSIS — M5441 Lumbago with sciatica, right side: Secondary | ICD-10-CM | POA: Diagnosis not present

## 2022-07-06 DIAGNOSIS — M9905 Segmental and somatic dysfunction of pelvic region: Secondary | ICD-10-CM | POA: Diagnosis not present

## 2022-07-18 DIAGNOSIS — M9902 Segmental and somatic dysfunction of thoracic region: Secondary | ICD-10-CM | POA: Diagnosis not present

## 2022-07-18 DIAGNOSIS — M5441 Lumbago with sciatica, right side: Secondary | ICD-10-CM | POA: Diagnosis not present

## 2022-07-18 DIAGNOSIS — M9903 Segmental and somatic dysfunction of lumbar region: Secondary | ICD-10-CM | POA: Diagnosis not present

## 2022-07-18 DIAGNOSIS — M9905 Segmental and somatic dysfunction of pelvic region: Secondary | ICD-10-CM | POA: Diagnosis not present

## 2022-07-20 DIAGNOSIS — M9905 Segmental and somatic dysfunction of pelvic region: Secondary | ICD-10-CM | POA: Diagnosis not present

## 2022-07-20 DIAGNOSIS — M9903 Segmental and somatic dysfunction of lumbar region: Secondary | ICD-10-CM | POA: Diagnosis not present

## 2022-07-20 DIAGNOSIS — M5441 Lumbago with sciatica, right side: Secondary | ICD-10-CM | POA: Diagnosis not present

## 2022-07-20 DIAGNOSIS — M9902 Segmental and somatic dysfunction of thoracic region: Secondary | ICD-10-CM | POA: Diagnosis not present

## 2022-07-26 ENCOUNTER — Encounter: Payer: Self-pay | Admitting: Internal Medicine

## 2022-07-26 DIAGNOSIS — M9905 Segmental and somatic dysfunction of pelvic region: Secondary | ICD-10-CM | POA: Diagnosis not present

## 2022-07-26 DIAGNOSIS — M5441 Lumbago with sciatica, right side: Secondary | ICD-10-CM | POA: Diagnosis not present

## 2022-07-26 DIAGNOSIS — M9902 Segmental and somatic dysfunction of thoracic region: Secondary | ICD-10-CM | POA: Diagnosis not present

## 2022-07-26 DIAGNOSIS — M9903 Segmental and somatic dysfunction of lumbar region: Secondary | ICD-10-CM | POA: Diagnosis not present

## 2022-07-31 DIAGNOSIS — M9903 Segmental and somatic dysfunction of lumbar region: Secondary | ICD-10-CM | POA: Diagnosis not present

## 2022-07-31 DIAGNOSIS — M9905 Segmental and somatic dysfunction of pelvic region: Secondary | ICD-10-CM | POA: Diagnosis not present

## 2022-07-31 DIAGNOSIS — M9902 Segmental and somatic dysfunction of thoracic region: Secondary | ICD-10-CM | POA: Diagnosis not present

## 2022-07-31 DIAGNOSIS — M5441 Lumbago with sciatica, right side: Secondary | ICD-10-CM | POA: Diagnosis not present

## 2022-08-08 ENCOUNTER — Other Ambulatory Visit (HOSPITAL_COMMUNITY): Payer: Self-pay

## 2022-08-09 ENCOUNTER — Ambulatory Visit (INDEPENDENT_AMBULATORY_CARE_PROVIDER_SITE_OTHER): Payer: 59 | Admitting: Family Medicine

## 2022-08-09 ENCOUNTER — Other Ambulatory Visit (HOSPITAL_COMMUNITY): Payer: Self-pay

## 2022-08-09 ENCOUNTER — Encounter: Payer: Self-pay | Admitting: Family Medicine

## 2022-08-09 VITALS — BP 142/90 | HR 67 | Temp 98.6°F | Ht 66.0 in | Wt 210.2 lb

## 2022-08-09 DIAGNOSIS — Z86010 Personal history of colonic polyps: Secondary | ICD-10-CM | POA: Diagnosis not present

## 2022-08-09 DIAGNOSIS — K766 Portal hypertension: Secondary | ICD-10-CM

## 2022-08-09 DIAGNOSIS — Z0000 Encounter for general adult medical examination without abnormal findings: Secondary | ICD-10-CM

## 2022-08-09 DIAGNOSIS — I10 Essential (primary) hypertension: Secondary | ICD-10-CM | POA: Diagnosis not present

## 2022-08-09 DIAGNOSIS — E669 Obesity, unspecified: Secondary | ICD-10-CM | POA: Diagnosis not present

## 2022-08-09 MED ORDER — TADALAFIL 20 MG PO TABS
20.0000 mg | ORAL_TABLET | Freq: Every day | ORAL | 5 refills | Status: DC | PRN
  Filled 2022-08-09 – 2022-09-06 (×2): qty 10, 10d supply, fill #0
  Filled 2022-10-09: qty 10, 10d supply, fill #1
  Filled 2022-11-06: qty 10, 10d supply, fill #2
  Filled 2022-12-08: qty 10, 10d supply, fill #3
  Filled 2023-01-10: qty 10, 10d supply, fill #4
  Filled 2023-02-06 – 2023-02-07 (×2): qty 10, 10d supply, fill #5

## 2022-08-09 MED ORDER — NADOLOL 40 MG PO TABS
40.0000 mg | ORAL_TABLET | Freq: Every day | ORAL | 3 refills | Status: DC
  Filled 2022-08-09 – 2022-10-09 (×2): qty 90, 90d supply, fill #0
  Filled 2023-01-10: qty 90, 90d supply, fill #1
  Filled 2023-05-01: qty 90, 90d supply, fill #2
  Filled 2023-08-08: qty 90, 90d supply, fill #3
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
## 2022-08-09 NOTE — Progress Notes (Deleted)
Complete physical exam  Patient: Christopher Craig   DOB: [birth year]   61 y.o. Male  MRN: [id number]  Subjective:    No chief complaint on file.   Christopher Craig is a 61 y.o. male who presents today for a complete physical exam. He reports consuming a {diet types:17450} diet. {types:19826} He generally feels {DESC; WELL/FAIRLY WELL/POORLY:18703}. He reports sleeping {DESC; WELL/FAIRLY WELL/POORLY:18703}. He {does/does not:200015} have additional problems to discuss today.    Most recent fall risk assessment:    02/07/2017    3:18 PM  [place] in the past year?      Information is confidential and restricted. Go to Review Flowsheets to unlock data.     Most recent depression screenings:    08/04/2021    3:19 PM 08/03/2020    2:48 PM  PHQ 2/9 Scores  PHQ - 2 Score 0 0    {VISON DENTAL STD PSA (Optional):27386}  {History (Optional):23778}  Patient Care Team: Denita Lung, MD as PCP - General (Family Medicine) Pyrtle, Lajuan Lines, MD as Consulting Physician (Gastroenterology)   Outpatient Medications Prior to Visit  Medication Sig   amoxicillin (AMOXIL) 500 MG capsule Take 2 capsules (1000 mg total) by mouth now, then take 1 capsule (500 mg total) by mouth every 6 (six) hours until gone   clobetasol cream (TEMOVATE) 9.62 % Apply 1 application topically 2 (two) times daily. (Patient not taking: No sig reported)   losartan (COZAAR) 50 MG tablet Take 1 tablet (50 mg total) by mouth daily.   Multiple Vitamins-Minerals (MULTIVITAMIN WITH MINERALS) tablet Take 1 tablet by mouth daily.   nadolol (CORGARD) 40 MG tablet Take 1 tablet (40 mg total) by mouth daily.   Omega-3 Fatty Acids (FISH OIL) 1200 MG CAPS Take 1,200 mg by mouth 2 (two) times daily.   tadalafil (CIALIS) 20 MG tablet Take 1 tablet (20 mg total) by mouth daily as needed for erectile dysfunction.   triamcinolone cream (KENALOG) 0.5 % Apply 1 application topically 2 (two) times daily as needed. (Patient not taking:  No sig reported)   vitamin B-12 (CYANOCOBALAMIN) 1000 MCG tablet Take 1 tablet (1,000 mcg total) by mouth daily. (Patient not taking: No sig reported)   No facility-administered medications prior to visit.    ROS        Objective:     There were no vitals taken for this visit. {Vitals History (Optional):23777}  Physical Exam   No results found for any visits on 08/09/22. {Show previous labs (optional):23779}    Assessment & Plan:    Routine Health Maintenance and Physical Exam  Immunization History  Administered Date(s) Administered   Influenza Split 09/10/2012, 08/07/2013, 08/13/2017   Influenza-Unspecified 08/14/2014, 08/12/2015, 08/14/2016, 08/13/2018, 08/15/2019, 08/26/2020, 12/18/2020, 08/18/2021   PFIZER(Purple Top)SARS-COV-2 Vaccination 11/27/2019, 12/18/2019, 09/08/2020   Tdap 11/30/2006, 02/07/2017   Zoster Recombinat (Shingrix) 12/15/2020, 02/16/2021    Health Maintenance  Topic Date Due   HIV Screening  Never done   COVID-19 Vaccine (4 - Pfizer series) 11/03/2020   INFLUENZA VACCINE  06/20/2022   COLONOSCOPY (Pts 45-75yr Insurance coverage will need to be confirmed)  09/15/2023   TETANUS/TDAP  02/08/2027   Hepatitis C Screening  Completed   Zoster Vaccines- Shingrix  Completed   HPV VACCINES  Aged Out    Discussed health benefits of physical activity, and encouraged him to engage in regular exercise appropriate for his age and condition.  Problem List Items Addressed This Visit   None  No  follow-ups on file.     Elyse Jarvis, RMA

## 2022-08-10 LAB — COMPREHENSIVE METABOLIC PANEL
ALT: 21 IU/L (ref 0–44)
AST: 22 IU/L (ref 0–40)
Albumin/Globulin Ratio: 1.8 (ref 1.2–2.2)
Albumin: 4.6 g/dL (ref 3.9–4.9)
Alkaline Phosphatase: 90 IU/L (ref 44–121)
BUN/Creatinine Ratio: 12 (ref 10–24)
BUN: 9 mg/dL (ref 8–27)
Bilirubin Total: 0.6 mg/dL (ref 0.0–1.2)
CO2: 24 mmol/L (ref 20–29)
Calcium: 9.2 mg/dL (ref 8.6–10.2)
Chloride: 104 mmol/L (ref 96–106)
Creatinine, Ser: 0.77 mg/dL (ref 0.76–1.27)
Globulin, Total: 2.5 g/dL (ref 1.5–4.5)
Glucose: 85 mg/dL (ref 70–99)
Potassium: 4.3 mmol/L (ref 3.5–5.2)
Sodium: 141 mmol/L (ref 134–144)
Total Protein: 7.1 g/dL (ref 6.0–8.5)
eGFR: 102 mL/min/{1.73_m2} (ref >59)

## 2022-08-10 LAB — LIPID PANEL
Chol/HDL Ratio: 3 ratio (ref 0.0–5.0)
Cholesterol, Total: 119 mg/dL (ref 100–199)
HDL: 40 mg/dL (ref >39)
LDL Chol Calc (NIH): 65 mg/dL (ref 0–99)
Triglycerides: 69 mg/dL (ref 0–149)
VLDL Cholesterol Cal: 14 mg/dL (ref 5–40)

## 2022-08-10 LAB — CBC WITH DIFFERENTIAL/PLATELET
Basophils Absolute: 0.1 10*3/uL (ref 0.0–0.2)
Basos: 1 %
EOS (ABSOLUTE): 0.1 10*3/uL (ref 0.0–0.4)
Eos: 2 %
Hematocrit: 46.5 % (ref 37.5–51.0)
Hemoglobin: 15.6 g/dL (ref 13.0–17.7)
Immature Grans (Abs): 0 10*3/uL (ref 0.0–0.1)
Immature Granulocytes: 0 %
Lymphocytes Absolute: 3.4 10*3/uL — ABNORMAL HIGH (ref 0.7–3.1)
Lymphs: 42 %
MCH: 30.4 pg (ref 26.6–33.0)
MCHC: 33.5 g/dL (ref 31.5–35.7)
MCV: 91 fL (ref 79–97)
Monocytes Absolute: 0.7 10*3/uL (ref 0.1–0.9)
Monocytes: 9 %
Neutrophils Absolute: 3.8 10*3/uL (ref 1.4–7.0)
Neutrophils: 46 %
Platelets: 209 10*3/uL (ref 150–450)
RBC: 5.13 x10E6/uL (ref 4.14–5.80)
RDW: 12.5 % (ref 11.6–15.4)
WBC: 8.1 10*3/uL (ref 3.4–10.8)

## 2022-08-11 ENCOUNTER — Encounter: Payer: Self-pay | Admitting: Internal Medicine

## 2022-08-11 ENCOUNTER — Encounter: Payer: 59 | Admitting: Family Medicine

## 2022-08-11 ENCOUNTER — Telehealth: Payer: Self-pay | Admitting: Internal Medicine

## 2022-08-11 ENCOUNTER — Encounter: Payer: Self-pay | Admitting: Family Medicine

## 2022-08-11 DIAGNOSIS — K766 Portal hypertension: Secondary | ICD-10-CM

## 2022-08-11 DIAGNOSIS — K7030 Alcoholic cirrhosis of liver without ascites: Secondary | ICD-10-CM

## 2022-08-11 NOTE — Telephone Encounter (Signed)
Patient called states his lab came result from his pcpc are out. States he want Dr Hilarie Fredrickson to review them. Also states that he is having trouble finding Dr Hilarie Fredrickson in [place] message.

## 2022-08-11 NOTE — Telephone Encounter (Signed)
Left patient a detailed vm letting him know that we will have Dr. Hilarie Fredrickson review his recent labs when he returns to the office next week. I told pt that we will call him if Dr. Hilarie Fredrickson has any additional recommendations.

## 2022-08-14 NOTE — Telephone Encounter (Signed)
Called and spoke with patient regarding lab results and recommendations. Pt is aware that we have placed the order for routine [place] and he knows to expect a call from radiology scheduling to set up his appt. Pt also wanted to know why he could not send MyChart message to Dr. Hilarie Fredrickson. I told pt it is probably because he has not been seen in over a year. I tried to set up his routine appt but December schedule is not available at this time. Pt would like to schedule follow up after his [place]. Pt had no other concerns at the end of the call.  [place] order in epic. Secure staff message sent to radiology scheduling to contact pt to set up appt.

## 2022-08-14 NOTE — Telephone Encounter (Signed)
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

## 2022-08-16 ENCOUNTER — Ambulatory Visit (HOSPITAL_COMMUNITY): Payer: 59

## 2022-08-23 ENCOUNTER — Ambulatory Visit (HOSPITAL_COMMUNITY): Payer: 59

## 2022-08-29 ENCOUNTER — Encounter: Payer: Self-pay | Admitting: Internal Medicine

## 2022-09-06 ENCOUNTER — Other Ambulatory Visit (HOSPITAL_COMMUNITY): Payer: Self-pay

## 2022-09-11 ENCOUNTER — Encounter: Payer: Self-pay | Admitting: Internal Medicine

## 2022-09-20 DIAGNOSIS — Z125 Encounter for screening for malignant neoplasm of prostate: Secondary | ICD-10-CM | POA: Diagnosis not present

## 2022-09-20 DIAGNOSIS — M546 Pain in thoracic spine: Secondary | ICD-10-CM | POA: Diagnosis not present

## 2022-09-20 DIAGNOSIS — N529 Male erectile dysfunction, unspecified: Secondary | ICD-10-CM | POA: Diagnosis not present

## 2022-09-20 DIAGNOSIS — M549 Dorsalgia, unspecified: Secondary | ICD-10-CM | POA: Diagnosis not present

## 2022-09-20 DIAGNOSIS — K7460 Unspecified cirrhosis of liver: Secondary | ICD-10-CM | POA: Diagnosis not present

## 2022-09-20 DIAGNOSIS — M545 Low back pain, unspecified: Secondary | ICD-10-CM | POA: Diagnosis not present

## 2022-09-20 DIAGNOSIS — K766 Portal hypertension: Secondary | ICD-10-CM | POA: Diagnosis not present

## 2022-09-20 DIAGNOSIS — Z1329 Encounter for screening for other suspected endocrine disorder: Secondary | ICD-10-CM | POA: Diagnosis not present

## 2022-09-22 DIAGNOSIS — N529 Male erectile dysfunction, unspecified: Secondary | ICD-10-CM | POA: Diagnosis not present

## 2022-09-22 DIAGNOSIS — K766 Portal hypertension: Secondary | ICD-10-CM | POA: Diagnosis not present

## 2022-09-22 DIAGNOSIS — K7460 Unspecified cirrhosis of liver: Secondary | ICD-10-CM | POA: Diagnosis not present

## 2022-09-22 DIAGNOSIS — M549 Dorsalgia, unspecified: Secondary | ICD-10-CM | POA: Diagnosis not present

## 2022-09-25 ENCOUNTER — Encounter: Payer: Self-pay | Admitting: Family Medicine

## 2022-09-25 ENCOUNTER — Encounter: Payer: 59 | Admitting: Internal Medicine

## 2022-09-28 DIAGNOSIS — M9902 Segmental and somatic dysfunction of thoracic region: Secondary | ICD-10-CM | POA: Diagnosis not present

## 2022-09-28 DIAGNOSIS — M9905 Segmental and somatic dysfunction of pelvic region: Secondary | ICD-10-CM | POA: Diagnosis not present

## 2022-09-28 DIAGNOSIS — M9903 Segmental and somatic dysfunction of lumbar region: Secondary | ICD-10-CM | POA: Diagnosis not present

## 2022-09-28 DIAGNOSIS — M5441 Lumbago with sciatica, right side: Secondary | ICD-10-CM | POA: Diagnosis not present

## 2022-10-04 ENCOUNTER — Encounter: Payer: Self-pay | Admitting: Family Medicine

## 2022-10-06 ENCOUNTER — Other Ambulatory Visit: Payer: Self-pay

## 2022-10-06 MED ORDER — COVID-19 MRNA 2023-2024 VACCINE (COMIRNATY) 0.3 ML INJECTION
0.3000 mL | Freq: Once | INTRAMUSCULAR | 0 refills | Status: AC
  Filled 2022-10-06: qty 0.3, 1d supply, fill #0

## 2022-10-09 ENCOUNTER — Other Ambulatory Visit (HOSPITAL_COMMUNITY): Payer: Self-pay

## 2022-10-09 ENCOUNTER — Encounter: Payer: Self-pay | Admitting: Family Medicine

## 2022-10-11 ENCOUNTER — Ambulatory Visit: Payer: 59 | Admitting: Physician Assistant

## 2022-10-25 DIAGNOSIS — M9903 Segmental and somatic dysfunction of lumbar region: Secondary | ICD-10-CM | POA: Diagnosis not present

## 2022-10-25 DIAGNOSIS — M9902 Segmental and somatic dysfunction of thoracic region: Secondary | ICD-10-CM | POA: Diagnosis not present

## 2022-10-25 DIAGNOSIS — M9905 Segmental and somatic dysfunction of pelvic region: Secondary | ICD-10-CM | POA: Diagnosis not present

## 2022-10-25 DIAGNOSIS — M5441 Lumbago with sciatica, right side: Secondary | ICD-10-CM | POA: Diagnosis not present

## 2022-11-22 ENCOUNTER — Ambulatory Visit (HOSPITAL_BASED_OUTPATIENT_CLINIC_OR_DEPARTMENT_OTHER): Payer: Commercial Managed Care - PPO

## 2022-11-23 ENCOUNTER — Ambulatory Visit: Payer: 59 | Admitting: Internal Medicine

## 2022-12-22 DIAGNOSIS — N529 Male erectile dysfunction, unspecified: Secondary | ICD-10-CM | POA: Diagnosis not present

## 2022-12-22 DIAGNOSIS — K766 Portal hypertension: Secondary | ICD-10-CM | POA: Diagnosis not present

## 2022-12-22 DIAGNOSIS — K7460 Unspecified cirrhosis of liver: Secondary | ICD-10-CM | POA: Diagnosis not present

## 2023-01-02 DIAGNOSIS — K7460 Unspecified cirrhosis of liver: Secondary | ICD-10-CM | POA: Diagnosis not present

## 2023-01-03 ENCOUNTER — Encounter: Payer: Self-pay | Admitting: Internal Medicine

## 2023-01-26 ENCOUNTER — Ambulatory Visit: Payer: 59 | Admitting: Internal Medicine

## 2023-01-30 DIAGNOSIS — H524 Presbyopia: Secondary | ICD-10-CM | POA: Diagnosis not present

## 2023-02-07 ENCOUNTER — Other Ambulatory Visit (HOSPITAL_COMMUNITY): Payer: Self-pay

## 2023-03-05 ENCOUNTER — Encounter: Payer: Self-pay | Admitting: Family Medicine

## 2023-03-05 MED ORDER — TADALAFIL 20 MG PO TABS
20.0000 mg | ORAL_TABLET | Freq: Every day | ORAL | 5 refills | Status: DC | PRN
  Filled 2023-03-05: qty 30, 30d supply, fill #0
  Filled 2023-04-12: qty 30, 30d supply, fill #1
  Filled 2023-05-14: qty 30, 30d supply, fill #2
  Filled 2023-06-07: qty 6, 30d supply, fill #3
  Filled 2023-07-09: qty 6, 30d supply, fill #4
  Filled 2023-08-08: qty 6, 30d supply, fill #5

## 2023-03-06 ENCOUNTER — Other Ambulatory Visit: Payer: Self-pay

## 2023-03-06 ENCOUNTER — Other Ambulatory Visit (HOSPITAL_COMMUNITY): Payer: Self-pay

## 2023-04-05 DIAGNOSIS — M25561 Pain in right knee: Secondary | ICD-10-CM | POA: Diagnosis not present

## 2023-04-05 DIAGNOSIS — M11261 Other chondrocalcinosis, right knee: Secondary | ICD-10-CM | POA: Diagnosis not present

## 2023-04-05 DIAGNOSIS — M1711 Unilateral primary osteoarthritis, right knee: Secondary | ICD-10-CM | POA: Diagnosis not present

## 2023-05-01 ENCOUNTER — Emergency Department (HOSPITAL_COMMUNITY)
Admission: EM | Admit: 2023-05-01 | Discharge: 2023-05-01 | Disposition: A | Discharge status: Home / Self Care | Payer: No Typology Code available for payment source | Attending: Emergency Medicine | Admitting: Emergency Medicine

## 2023-05-01 ENCOUNTER — Other Ambulatory Visit (HOSPITAL_COMMUNITY): Payer: Self-pay

## 2023-05-01 ENCOUNTER — Encounter (HOSPITAL_COMMUNITY): Payer: Self-pay

## 2023-05-01 ENCOUNTER — Other Ambulatory Visit: Payer: Self-pay

## 2023-05-01 ENCOUNTER — Emergency Department (HOSPITAL_COMMUNITY): Payer: No Typology Code available for payment source

## 2023-05-01 DIAGNOSIS — M545 Low back pain, unspecified: Secondary | ICD-10-CM | POA: Insufficient documentation

## 2023-05-01 DIAGNOSIS — S298XXA Other specified injuries of thorax, initial encounter: Secondary | ICD-10-CM

## 2023-05-01 DIAGNOSIS — S80211A Abrasion, right knee, initial encounter: Secondary | ICD-10-CM | POA: Diagnosis not present

## 2023-05-01 DIAGNOSIS — S80212A Abrasion, left knee, initial encounter: Secondary | ICD-10-CM | POA: Diagnosis not present

## 2023-05-01 DIAGNOSIS — Y92410 Unspecified street and highway as the place of occurrence of the external cause: Secondary | ICD-10-CM | POA: Diagnosis not present

## 2023-05-01 DIAGNOSIS — S01512A Laceration without foreign body of oral cavity, initial encounter: Secondary | ICD-10-CM | POA: Diagnosis not present

## 2023-05-01 DIAGNOSIS — S20212A Contusion of left front wall of thorax, initial encounter: Secondary | ICD-10-CM

## 2023-05-01 DIAGNOSIS — Z041 Encounter for examination and observation following transport accident: Secondary | ICD-10-CM | POA: Diagnosis not present

## 2023-05-01 MED ORDER — NAPROXEN 375 MG PO TABS
375.0000 mg | ORAL_TABLET | Freq: Two times a day (BID) | ORAL | 0 refills | Status: DC
  Filled 2023-05-01: qty 20, 10d supply, fill #0

## 2023-05-01 MED ORDER — AMOXICILLIN 500 MG PO CAPS
1000.0000 mg | ORAL_CAPSULE | Freq: Two times a day (BID) | ORAL | 0 refills | Status: AC
  Filled 2023-05-01: qty 28, 7d supply, fill #0

## 2023-05-01 MED ORDER — METHOCARBAMOL 500 MG PO TABS
500.0000 mg | ORAL_TABLET | Freq: Three times a day (TID) | ORAL | 0 refills | Status: DC | PRN
  Filled 2023-05-01: qty 21, 7d supply, fill #0

## 2023-05-01 MED ORDER — BUPIVACAINE HCL (PF) 0.5 % IJ SOLN
10.0000 mL | Freq: Once | INTRAMUSCULAR | Status: DC
  Filled 2023-05-01: qty 10

## 2023-05-01 NOTE — ED Notes (Signed)
Patient ambulated to the restroom independently  

## 2023-05-01 NOTE — Discharge Instructions (Signed)
Contact a health care provider if: Your pain is not controlled with medicine. You have: A fever or chills. Redness, swelling, or pain on your wound, and it is getting worse. Fresh bleeding or pus coming from your wound. Swollen or tender glands in your throat. Get help right away if: The edges of your wound break open. Your face or the area under your jaw becomes swollen. You have trouble breathing or swallowing. These symptoms may be an emergency. Get help right away. Call 911. Do not wait to see if the symptoms will go away. Do not drive yourself to the hospital.  Get help right away if: You experience any of the following: Shortness of breath. Pain in your abdomen. Nausea or vomiting. A fever or chills. A rapid heart rate. You cough up blood. You feel dizzy or weak. You faint. You have severe chest pain. This may come with other symptoms, such as: Dizziness. Shortness of breath. Pain in your neck, jaw, or back, or in one arm or both arms. These symptoms may represent a serious problem that is an emergency. Do not wait to see if the symptoms will go away. Get medical help right away. Call your local emergency services (911 in the [place] Do not drive yourself to the hospital.
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
## 2023-05-01 NOTE — ED Notes (Signed)
Pt given incentive spirometer.

## 2023-05-01 NOTE — ED Notes (Signed)
Patient transported to X-ray 
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
## 2023-05-02 ENCOUNTER — Other Ambulatory Visit (HOSPITAL_COMMUNITY): Payer: Self-pay

## 2023-05-23 ENCOUNTER — Encounter: Payer: Self-pay | Admitting: Internal Medicine

## 2023-06-08 ENCOUNTER — Other Ambulatory Visit (HOSPITAL_COMMUNITY): Payer: Self-pay

## 2023-08-14 ENCOUNTER — Other Ambulatory Visit (HOSPITAL_COMMUNITY): Payer: Self-pay

## 2023-08-14 ENCOUNTER — Ambulatory Visit (AMBULATORY_SURGERY_CENTER): Payer: Commercial Managed Care - PPO | Admitting: *Deleted

## 2023-08-14 VITALS — Ht 68.0 in | Wt 196.0 lb

## 2023-08-14 DIAGNOSIS — Z86010 Personal history of colonic polyps: Secondary | ICD-10-CM

## 2023-08-14 MED ORDER — NA SULFATE-K SULFATE-MG SULF 17.5-3.13-1.6 GM/177ML PO SOLN
1.0000 | Freq: Once | ORAL | 0 refills | Status: AC
Start: 2023-08-14 — End: 2023-08-15
  Filled 2023-08-14: qty 354, 2d supply, fill #0

## 2023-08-14 NOTE — Progress Notes (Signed)
Pt's name and DOB verified at the beginning of the pre-visit.  Pt denies any difficulty with ambulating,sitting, laying down or rolling side to side Gave both [facility] main # and MD on call # prior to instructions.  No egg or soy allergy known to patient  No issues known to pt with past sedation with any surgeries or procedures Pt denies having issues being intubated Pt has no issues moving head neck or swallowing No FH of Malignant Hyperthermia Pt is not on diet pills Pt is not on home 02  Pt is not on blood thinners  Pt denies issues with constipation  Pt is not on dialysis Pt denise any abnormal heart rhythms  Pt denies any upcoming cardiac testing Pt encouraged to use to use Singlecare or Goodrx to reduce cost  Patient's chart reviewed by Cathlyn Parsons CNRA prior to pre-visit and patient appropriate for the [facility].  Pre-visit completed and red dot placed by patient's name on their procedure day (on provider's schedule).  . Visit by phone Pt states weight is 196 lb Instructed pt why it is important to and  to call if they have any changes in health or new medications. Directed them to the # given and on instructions.   Pt states they will.  Instructions reviewed with pt and pt states understanding. Instructed to review again prior to procedure. Pt states they will.  Instructions sent by mail with coupon and by my chart

## 2023-08-17 ENCOUNTER — Encounter: Payer: Self-pay | Admitting: Family Medicine

## 2023-08-20 ENCOUNTER — Other Ambulatory Visit (HOSPITAL_COMMUNITY): Payer: Self-pay

## 2023-08-20 ENCOUNTER — Encounter: Payer: Self-pay | Admitting: Internal Medicine

## 2023-08-28 ENCOUNTER — Ambulatory Visit: Payer: Commercial Managed Care - PPO | Admitting: Internal Medicine

## 2023-08-28 ENCOUNTER — Encounter: Payer: Self-pay | Admitting: Internal Medicine

## 2023-08-28 VITALS — BP 110/71 | HR 53 | Temp 99.1°F | Resp 9 | Ht 68.0 in | Wt 196.0 lb

## 2023-08-28 DIAGNOSIS — D125 Benign neoplasm of sigmoid colon: Secondary | ICD-10-CM

## 2023-08-28 DIAGNOSIS — Z860100 Personal history of colon polyps, unspecified: Secondary | ICD-10-CM

## 2023-08-28 DIAGNOSIS — D124 Benign neoplasm of descending colon: Secondary | ICD-10-CM

## 2023-08-28 DIAGNOSIS — Z1211 Encounter for screening for malignant neoplasm of colon: Secondary | ICD-10-CM | POA: Diagnosis not present

## 2023-08-28 DIAGNOSIS — Z860101 Personal history of adenomatous and serrated colon polyps: Secondary | ICD-10-CM | POA: Diagnosis not present

## 2023-08-28 DIAGNOSIS — I10 Essential (primary) hypertension: Secondary | ICD-10-CM | POA: Diagnosis not present

## 2023-08-28 DIAGNOSIS — Z09 Encounter for follow-up examination after completed treatment for conditions other than malignant neoplasm: Secondary | ICD-10-CM

## 2023-08-28 DIAGNOSIS — K635 Polyp of colon: Secondary | ICD-10-CM | POA: Diagnosis not present

## 2023-08-28 MED ORDER — SODIUM CHLORIDE 0.9 % IV SOLN: 500.0000 mL | Freq: Once | INTRAVENOUS | Status: DC
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
## 2023-08-28 NOTE — Progress Notes (Signed)
Called to room to assist during endoscopic procedure.  Patient ID and intended procedure confirmed with present staff. Received instructions for my participation in the procedure from the performing physician.  

## 2023-08-28 NOTE — Progress Notes (Signed)
Report to PACU, RN, vss, BBS= Clear.  

## 2023-08-28 NOTE — Op Note (Signed)
[facility] Patient Name: Christopher Craig Procedure Date: 08/28/2023 8:11 AM MRN: [id number] Endoscopist: Beverley Fiedler , MD, [id number] Age: 62 Referring MD:  Date of Birth: 27-Dec-1960 Gender: Male Account #: [id number] Procedure:                Colonoscopy Indications:              High risk colon cancer surveillance: Personal                            history of multiple adenomas, Last colonoscopy:                            October 2021 (4 TAs), 2016 (2 TAs), 2014 (>10 TAs) Medicines:                Monitored Anesthesia Care Procedure:                Pre-Anesthesia Assessment:                           - Prior to the procedure, a History and Physical                            was performed, and patient medications and                            allergies were reviewed. The patient's tolerance of                            previous anesthesia was also reviewed. The risks                            and benefits of the procedure and the sedation                            options and risks were discussed with the patient.                            All questions were answered, and informed consent                            was obtained. Prior Anticoagulants: The patient has                            taken no anticoagulant or antiplatelet agents. ASA                            Grade Assessment: III - A patient with severe                            systemic disease. After reviewing the risks and                            benefits, the patient was deemed in satisfactory  condition to undergo the procedure.                           After obtaining informed consent, the colonoscope                            was passed under direct vision. Throughout the                            procedure, the patient's blood pressure, pulse, and                            oxygen saturations were monitored continuously. The                            CF [id number]  #[id number] was introduced through the anus                            and advanced to the cecum, identified by                            appendiceal orifice and ileocecal valve. The                            colonoscopy was performed without difficulty. The                            patient tolerated the procedure well. The quality                            of the bowel preparation was good. The ileocecal                            valve, appendiceal orifice, and rectum were                            photographed. Scope In: 8:27:46 AM Scope Out: 8:41:49 AM Scope Withdrawal Time: 0 hours 10 minutes 31 seconds  Total Procedure Duration: 0 hours 14 minutes 3 seconds  Findings:                 The digital rectal exam was normal.                           A 5 mm polyp was found in the descending colon. The                            polyp was sessile. The polyp was removed with a                            cold snare. Resection and retrieval were complete.                           A 3 mm polyp was found in the sigmoid colon. The  polyp was sessile. The polyp was removed with a                            cold snare. Resection and retrieval were complete.                           Scattered small-mouthed diverticula were found in                            the sigmoid colon, descending colon and transverse                            colon.                           Internal hemorrhoids were found during                            retroflexion. The hemorrhoids were small. Complications:            No immediate complications. Estimated Blood Loss:     Estimated blood loss was minimal. Impression:               - One 5 mm polyp in the descending colon, removed                            with a cold snare. Resected and retrieved.                           - One 3 mm polyp in the sigmoid colon, removed with                            a cold snare. Resected and  retrieved.                           - Mild diverticulosis in the sigmoid colon, in the                            descending colon and in the transverse colon.                           - Small internal hemorrhoids. Recommendation:           - Patient has a contact number available for                            emergencies. The signs and symptoms of potential                            delayed complications were discussed with the                            patient. Return to normal activities tomorrow.  Written discharge instructions were provided to the                            patient.                           - Resume previous diet.                           - Continue present medications including nadolol                            (esophageal variceal surveillance is not needed as                            long as patient remains on nonselective beta                            blocker and cirrhosis remains compensated).                           - Await pathology results.                           - Liver ultrasound is recommended every 6 months in                            the setting of stable hepatic cirrhosis (these are                            being done with the [facility]).                           - Repeat colonoscopy is recommended for                            surveillance in 5 years. The colonoscopy date will                            be confirmed after pathology results from today's                            exam become available for review. Beverley Fiedler, MD 08/28/2023 8:45:31 AM This report has been signed electronically.

## 2023-08-28 NOTE — Patient Instructions (Addendum)
- Resume previous diet. - Continue present medications including nadolol (esophageal variceal surveillance is not needed as long as patient remains on nonselective beta blocker and cirrhosis remains compensated). - Await pathology results. - Liver ultrasound is recommended every 6 months in the setting of stable hepatic cirrhosis (these are being done with the [facility]). - Repeat colonoscopy is recommended for surveillance in 5 years. The colonoscopy date will be confirmed after pathology result - Educational handouts related to procedure given.  YOU HAD AN ENDOSCOPIC PROCEDURE TODAY AT THE [facility]:   Refer to the procedure report that was given to you for any specific questions about what was found during the examination.  If the procedure report does not answer your questions, please call your gastroenterologist to clarify.  If you requested that your care partner not be given the details of your procedure findings, then the procedure report has been included in a sealed envelope for you to review at your convenience later.  YOU SHOULD EXPECT: Some feelings of bloating in the abdomen. Passage of more gas than usual.  Walking can help get rid of the air that was put into your GI tract during the procedure and reduce the bloating. If you had a lower endoscopy (such as a colonoscopy or flexible sigmoidoscopy) you may notice spotting of blood in your stool or on the toilet paper. If you underwent a bowel prep for your procedure, you may not have a normal bowel movement for a few days.  Please Note:  You might notice some irritation and congestion in your nose or some drainage.  This is from the oxygen used during your procedure.  There is no need for concern and it should clear up in a day or so.  SYMPTOMS TO REPORT IMMEDIATELY:  Following lower endoscopy (colonoscopy or flexible sigmoidoscopy):  Excessive amounts of blood in the stool  Significant tenderness or worsening of abdominal  pains  Swelling of the abdomen that is new, acute  Fever of 100F or higher  For urgent or emergent issues, a gastroenterologist can be reached at any hour by calling [phone]. Do not use MyChart messaging for urgent concerns.    DIET:  We do recommend a small meal at first, but then you may proceed to your regular diet.  Drink plenty of fluids but you should avoid alcoholic beverages for 24 hours.  ACTIVITY:  You should plan to take it easy for the rest of today and you should NOT DRIVE or use heavy machinery until tomorrow (because of the sedation medicines used during the test).    FOLLOW UP: Our staff will call the number listed on your records the next business day following your procedure.  We will call around 7:15- 8:00 am to check on you and address any questions or concerns that you may have regarding the information given to you following your procedure. If we do not reach you, we will leave a message.     If any biopsies were taken you will be contacted by phone or by letter within the next 1-3 weeks.  Please call us at [phone] if you have not heard about the biopsies in 3 weeks.    SIGNATURES/CONFIDENTIALITY: You and/or your care partner have signed paperwork which will be entered into your electronic medical record.  These signatures attest to the fact that that the information above on your After Visit Summary has been reviewed and is understood.  Full responsibility of the confidentiality of this discharge  information lies with you and/or your care-partner.

## 2023-08-28 NOTE — Progress Notes (Signed)
Pt's states no medical or surgical changes since previsit or office visit. 

## 2023-08-29 ENCOUNTER — Telehealth: Payer: Self-pay | Admitting: *Deleted

## 2023-08-29 NOTE — Telephone Encounter (Signed)
  Follow up Call-     08/28/2023    7:48 AM  Call back number  Post procedure Call Back phone  # [phone]  Permission to leave phone message Yes     Patient questions:  Do you have a fever, pain , or abdominal swelling? No. Pain Score  0 *  Have you tolerated food without any problems? Yes.    Have you been able to return to your normal activities? Yes.    Do you have any questions about your discharge instructions: Diet   No. Medications  No. Follow up visit  No.  Do you have questions or concerns about your Care? Yes.    Actions: * If pain score is 4 or above: No action needed, pain <4.

## 2023-08-30 ENCOUNTER — Encounter: Payer: Self-pay | Admitting: Internal Medicine

## 2023-08-30 LAB — SURGICAL PATHOLOGY

## 2023-09-04 ENCOUNTER — Other Ambulatory Visit (HOSPITAL_COMMUNITY): Payer: Self-pay

## 2023-09-04 ENCOUNTER — Encounter: Payer: Self-pay | Admitting: Family Medicine

## 2023-09-04 ENCOUNTER — Ambulatory Visit: Payer: Commercial Managed Care - PPO | Admitting: Family Medicine

## 2023-09-04 VITALS — BP 130/78 | HR 62 | Ht 66.0 in | Wt 196.4 lb

## 2023-09-04 DIAGNOSIS — K766 Portal hypertension: Secondary | ICD-10-CM

## 2023-09-04 DIAGNOSIS — Z860100 Personal history of colon polyps, unspecified: Secondary | ICD-10-CM | POA: Diagnosis not present

## 2023-09-04 DIAGNOSIS — M1990 Unspecified osteoarthritis, unspecified site: Secondary | ICD-10-CM

## 2023-09-04 DIAGNOSIS — K7030 Alcoholic cirrhosis of liver without ascites: Secondary | ICD-10-CM | POA: Diagnosis not present

## 2023-09-04 DIAGNOSIS — N5201 Erectile dysfunction due to arterial insufficiency: Secondary | ICD-10-CM | POA: Diagnosis not present

## 2023-09-04 DIAGNOSIS — E669 Obesity, unspecified: Secondary | ICD-10-CM

## 2023-09-04 DIAGNOSIS — Z0000 Encounter for general adult medical examination without abnormal findings: Secondary | ICD-10-CM | POA: Diagnosis not present

## 2023-09-04 DIAGNOSIS — I10 Essential (primary) hypertension: Secondary | ICD-10-CM

## 2023-09-04 MED ORDER — TADALAFIL 20 MG PO TABS
20.0000 mg | ORAL_TABLET | Freq: Every day | ORAL | 5 refills | Status: DC | PRN
  Filled 2023-09-04: qty 6, 30d supply, fill #0
  Filled 2023-10-12: qty 6, 30d supply, fill #1
  Filled 2023-11-08: qty 6, 30d supply, fill #2
  Filled 2024-01-24: qty 6, 30d supply, fill #3

## 2023-09-04 MED ORDER — NADOLOL 40 MG PO TABS
40.0000 mg | ORAL_TABLET | Freq: Every day | ORAL | 3 refills | Status: DC
Start: 2023-09-04 — End: 2024-09-04
  Filled 2023-09-04 – 2023-11-08 (×2): qty 90, 90d supply, fill #0
  Filled 2024-02-06: qty 90, 90d supply, fill #1
  Filled 2024-05-14: qty 90, 90d supply, fill #2

## 2023-09-04 NOTE — Progress Notes (Signed)
Complete physical exam  Patient: Christopher Craig   DOB: [birth year]   62 y.o. Male  MRN: [id number]  Subjective:    Chief Complaint  Patient presents with   Annual Exam    Fasting annual exam. No concerns.     Christopher Craig is a 62 y.o. male who presents today for a complete physical exam. He reports consuming a general diet.  He generally feels well. He reports sleeping well. He does not have additional problems to discuss today.  He is now 5-1/2 years alcohol free.  He did have a recent colonoscopy and is scheduled for repeat in 5 years due to tubular adenoma.  He has been seen by the [place] and did have ultrasound done on his abdomen for follow-up and apparently according to him it looked good.  He continues on Corgard and having no difficulty with that and would like a refill on his Cialis.  He does not complain of any major arthritic symptoms.   Most recent fall risk assessment:    09/04/2023    3:29 PM  Fall Risk   Falls in the past year? 0  Number falls in past yr: 0  Injury with Fall? 0  Risk for fall due to : No Fall Risks  Follow up Falls evaluation completed     Most recent depression screenings:    09/04/2023    3:29 PM 08/09/2022    3:13 PM  PHQ 2/9 Scores  PHQ - 2 Score 0 0        Patient Care Team: Ronnald Nian, MD as PCP - General (Family Medicine) Pyrtle, Carie Caddy, MD as Consulting Physician (Gastroenterology)   Outpatient Medications Prior to Visit  Medication Sig Note   Omega-3 Fatty Acids (FISH OIL) 1200 MG CAPS Take 1,200 mg by mouth 2 (two) times daily.    [DISCONTINUED] nadolol (CORGARD) 40 MG tablet Take 1 tablet (40 mg total) by mouth daily.    [DISCONTINUED] tadalafil (CIALIS) 20 MG tablet Take 1 tablet (20 mg total) by mouth daily as needed for erectile dysfunction. (Patient not taking: Reported on 09/04/2023) 09/04/2023: As needed   No facility-administered medications prior to visit.    Review of Systems  All other systems reviewed and  are negative.         Objective:     BP 130/78   Pulse 62   Ht 5\' 6"  (1.676 m)   Wt 196 lb 6.4 oz (89.1 kg)   BMI 31.70 kg/m    Physical Exam  Alert and in no distress. Tympanic membranes and canals are normal. Pharyngeal area is normal. Neck is supple without adenopathy or thyromegaly. Cardiac exam shows a regular sinus rhythm without murmurs or gallops. Lungs are clear to auscultation.  Abdominal exam shows no masses or tenderness.  Normal bowel sounds.      Assessment & Plan:    Routine general medical examination at a health care facility - Plan: CBC with Differential/Platelet, Comprehensive metabolic panel, Lipid panel  Portal hypertension (HCC)  Obesity (BMI 30-39.9)  Primary hypertension - Plan: nadolol (CORGARD) 40 MG tablet  Erectile dysfunction due to arterial insufficiency - Plan: tadalafil (CIALIS) 20 MG tablet  Arthritis  Alcoholic cirrhosis of liver without ascites (HCC)  History of colonic polyps  Immunization History  Administered Date(s) Administered   Influenza Split 09/10/2012, 08/07/2013, 08/13/2017   Influenza,inj,Quad PF,6+ Mos 08/11/2022   Influenza-Unspecified 08/14/2014, 08/12/2015, 08/14/2016, 08/13/2018, 08/15/2019, 08/26/2020, 12/18/2020, 08/18/2021, 08/17/2023   PFIZER(Purple Top)SARS-COV-2 Vaccination 11/27/2019, 12/18/2019,  09/08/2020   Pfizer(Comirnaty)Fall Seasonal Vaccine 12 years and older 10/06/2022   Tdap 11/30/2006, 02/07/2017   Zoster Recombinant(Shingrix) 12/15/2020, 02/16/2021    Health Maintenance  Topic Date Due   HIV Screening  Never done   DTaP/Tdap/Td (3 - Td or Tdap) 02/08/2027   Colonoscopy  08/27/2028   INFLUENZA VACCINE  Completed   COVID-19 Vaccine  Completed   Hepatitis C Screening  Completed   Zoster Vaccines- Shingrix  Completed   HPV VACCINES  Aged Out    Discussed health benefits of physical activity, and encouraged him to engage in regular exercise  Problem List Items Addressed This Visit      Alcoholic cirrhosis of liver without ascites (HCC)   Arthritis   Erectile dysfunction   Relevant Medications   tadalafil (CIALIS) 20 MG tablet   History of colonic polyps   Hypertension   Relevant Medications   tadalafil (CIALIS) 20 MG tablet   nadolol (CORGARD) 40 MG tablet   Obesity (BMI 30-39.9)   Portal hypertension (HCC)   Relevant Medications   tadalafil (CIALIS) 20 MG tablet   nadolol (CORGARD) 40 MG tablet   Other Visit Diagnoses     Routine general medical examination at a health care facility    -  Primary   Relevant Orders   CBC with Differential/Platelet   Comprehensive metabolic panel   Lipid panel     Also encouraged him to make further diet and exercise changes to help with weight. Follow-up 1 year    Sharlot Gowda, MD

## 2023-09-05 ENCOUNTER — Encounter: Payer: Self-pay | Admitting: Internal Medicine

## 2023-09-05 LAB — CBC WITH DIFFERENTIAL/PLATELET
Basophils Absolute: 0 10*3/uL (ref 0.0–0.2)
Basos: 1 %
EOS (ABSOLUTE): 0.1 10*3/uL (ref 0.0–0.4)
Eos: 2 %
Hematocrit: 46 % (ref 37.5–51.0)
Hemoglobin: 15.4 g/dL (ref 13.0–17.7)
Immature Grans (Abs): 0 10*3/uL (ref 0.0–0.1)
Immature Granulocytes: 0 %
Lymphocytes Absolute: 3 10*3/uL (ref 0.7–3.1)
Lymphs: 42 %
MCH: 31.4 pg (ref 26.6–33.0)
MCHC: 33.5 g/dL (ref 31.5–35.7)
MCV: 94 fL (ref 79–97)
Monocytes Absolute: 0.7 10*3/uL (ref 0.1–0.9)
Monocytes: 10 %
Neutrophils Absolute: 3.3 10*3/uL (ref 1.4–7.0)
Neutrophils: 45 %
Platelets: 151 10*3/uL (ref 150–450)
RBC: 4.9 x10E6/uL (ref 4.14–5.80)
RDW: 11.7 % (ref 11.6–15.4)
WBC: 7.1 10*3/uL (ref 3.4–10.8)

## 2023-09-05 LAB — COMPREHENSIVE METABOLIC PANEL
ALT: 24 IU/L (ref 0–44)
AST: 26 IU/L (ref 0–40)
Albumin: 4.2 g/dL (ref 3.9–4.9)
Alkaline Phosphatase: 84 IU/L (ref 44–121)
BUN/Creatinine Ratio: 19 (ref 10–24)
BUN: 15 mg/dL (ref 8–27)
Bilirubin Total: 0.8 mg/dL (ref 0.0–1.2)
CO2: 23 mmol/L (ref 20–29)
Calcium: 9.1 mg/dL (ref 8.6–10.2)
Chloride: 104 mmol/L (ref 96–106)
Creatinine, Ser: 0.79 mg/dL (ref 0.76–1.27)
Globulin, Total: 2.4 g/dL (ref 1.5–4.5)
Glucose: 89 mg/dL (ref 70–99)
Potassium: 4.2 mmol/L (ref 3.5–5.2)
Sodium: 139 mmol/L (ref 134–144)
Total Protein: 6.6 g/dL (ref 6.0–8.5)
eGFR: 100 mL/min/{1.73_m2} (ref >59)

## 2023-09-05 LAB — LIPID PANEL
Cholesterol, Total: 108 mg/dL (ref 100–199)
HDL: 41 mg/dL (ref >39)
LDL CALC COMMENT:: 2.6 ratio (ref 0.0–5.0)
LDL Chol Calc (NIH): 51 mg/dL (ref 0–99)
Triglycerides: 79 mg/dL (ref 0–149)
VLDL Cholesterol Cal: 16 mg/dL (ref 5–40)

## 2023-11-09 ENCOUNTER — Other Ambulatory Visit: Payer: Self-pay

## 2023-11-09 ENCOUNTER — Other Ambulatory Visit (HOSPITAL_COMMUNITY): Payer: Self-pay

## 2024-01-03 ENCOUNTER — Encounter: Payer: Self-pay | Admitting: Internal Medicine

## 2024-01-04 ENCOUNTER — Encounter: Payer: Self-pay | Admitting: Family Medicine

## 2024-01-15 ENCOUNTER — Encounter: Payer: Self-pay | Admitting: Family Medicine

## 2024-01-15 ENCOUNTER — Encounter: Payer: Self-pay | Admitting: Internal Medicine

## 2024-02-22 ENCOUNTER — Encounter: Payer: Self-pay | Admitting: Internal Medicine

## 2024-02-22 NOTE — Telephone Encounter (Signed)
 Let him know that he cannot scan it by himself into the MyChart He can fax it to the number that Dottie gave him and we will get it entered permanently into his record Carie Caddy. Aquarius Tremper, M.D.  02/22/2024

## 2024-02-25 NOTE — Telephone Encounter (Signed)
 Encounter Notes: All associated encounter notes  This section contains the clinical notes associated to the Encounter.   Encounter Notes: All associated encounter notes Date/Time Encounter Note(s) Provider Source  Jan 31, 2024 01:13 PM HEPATOLOGY CONSULT: LOCAL TITLE: FIBROSCAN CONSULT NOTE STANDARD TITLE: HEPATOLOGY CONSULT DATE OF NOTE: Jan 31, 2024@13 :13 ENTRY DATE: Jan 31, 2024@13 :13:38 AUTHOR: BAJILLAN,HENDREN AK EXP COSIGNER: URGENCY: STATUS: COMPLETED  FIBROSCAN/VIBRATION CONTROLLED TRANSIENT ELASTOGRAPHY (VCTE) INTERPRETATION   Wt: 200.4 lb [90.90 kg] (01/31/2024 11:47) BODY MASS INDEX - 30.47 (Jan 31, 2024@11 :47)   Results:  - The median Liver Stiffness Measurement (LSM): 12.6 kPa - The Interquartile Range to Median ratio for all Stiffness measurements was 6% (<30%) suggesting low-average-high-quality reliability data acquisition.  Conclusion: Liver Stiffness Score is consistent with: F4   Interpretation: < 5.5 kPa: Indicative of no or minimal fibrosis 5.57.0 kPa: Suggestive of mild fibrosis 7.19.5 kPa: Consistent with moderate fibrosis 9.612.5 kPa: Severe fibrosis 12.5 kPa: Suggestive of cirrhosis  Results:  - The median Controlled Attenuation Parameter (CAP): 238 dB/m  Conclusion: Controlled Attenuation Parameter is consistent with: S1  Interpretation: < 238 dB/m: Normal 238260 dB/m: Mild steatosis 260290 dB/m: Moderate steatosis 290 dB/m: Severe steatosis   Fibroscan results can be affected by non-fasting state (fast 3 hours), high ALT >100 U/mL), hepatic congestion, steatohepatitis, alcohol, and cholestasis.   /es/ Hendren A. Tana Felts, MD INFECTIOUS DISEASES Signed: 01/31/2024 13:15

## 2024-02-26 NOTE — Telephone Encounter (Signed)
 His fibro scan is consistent with his known diagnosis of cirrhosis

## 2024-03-05 ENCOUNTER — Telehealth: Payer: Self-pay | Admitting: Family Medicine

## 2024-03-05 NOTE — Telephone Encounter (Signed)
 Pt refuses to schedule his physical with Dr.Jha. He wanted me to ask if you would still do his cpes.

## 2024-05-14 ENCOUNTER — Other Ambulatory Visit (HOSPITAL_COMMUNITY): Payer: Self-pay

## 2024-08-15 ENCOUNTER — Encounter: Payer: Self-pay | Admitting: Family Medicine

## 2024-09-04 ENCOUNTER — Encounter: Payer: Self-pay | Admitting: Family Medicine

## 2024-09-04 ENCOUNTER — Ambulatory Visit: Admitting: Family Medicine

## 2024-09-04 ENCOUNTER — Other Ambulatory Visit (HOSPITAL_COMMUNITY): Payer: Self-pay

## 2024-09-04 VITALS — BP 130/76 | HR 78 | Ht 66.0 in | Wt 204.0 lb

## 2024-09-04 DIAGNOSIS — M75111 Incomplete rotator cuff tear or rupture of right shoulder, not specified as traumatic: Secondary | ICD-10-CM

## 2024-09-04 DIAGNOSIS — K7030 Alcoholic cirrhosis of liver without ascites: Secondary | ICD-10-CM | POA: Diagnosis not present

## 2024-09-04 DIAGNOSIS — L308 Other specified dermatitis: Secondary | ICD-10-CM

## 2024-09-04 DIAGNOSIS — F1021 Alcohol dependence, in remission: Secondary | ICD-10-CM

## 2024-09-04 DIAGNOSIS — Z860100 Personal history of colon polyps, unspecified: Secondary | ICD-10-CM

## 2024-09-04 DIAGNOSIS — Z13220 Encounter for screening for lipoid disorders: Secondary | ICD-10-CM | POA: Diagnosis not present

## 2024-09-04 DIAGNOSIS — K766 Portal hypertension: Secondary | ICD-10-CM

## 2024-09-04 DIAGNOSIS — I10 Essential (primary) hypertension: Secondary | ICD-10-CM | POA: Diagnosis not present

## 2024-09-04 DIAGNOSIS — E669 Obesity, unspecified: Secondary | ICD-10-CM | POA: Diagnosis not present

## 2024-09-04 DIAGNOSIS — Z125 Encounter for screening for malignant neoplasm of prostate: Secondary | ICD-10-CM

## 2024-09-04 DIAGNOSIS — M1990 Unspecified osteoarthritis, unspecified site: Secondary | ICD-10-CM | POA: Diagnosis not present

## 2024-09-04 DIAGNOSIS — Z0000 Encounter for general adult medical examination without abnormal findings: Secondary | ICD-10-CM | POA: Diagnosis not present

## 2024-09-04 DIAGNOSIS — N5201 Erectile dysfunction due to arterial insufficiency: Secondary | ICD-10-CM

## 2024-09-04 DIAGNOSIS — Z23 Encounter for immunization: Secondary | ICD-10-CM

## 2024-09-04 MED ORDER — NADOLOL 40 MG PO TABS
40.0000 mg | ORAL_TABLET | Freq: Every day | ORAL | 3 refills | Status: AC
  Filled 2024-09-04 – 2024-10-30 (×2): qty 90, 90d supply, fill #0

## 2024-09-04 NOTE — Progress Notes (Signed)
 Complete physical exam  Patient: Christopher Craig   DOB: 17-Nov-1961   63 y.o. Male  MRN: [id number]  Subjective:    Chief Complaint  Patient presents with   Annual Exam    Fabian Walder is a 63 y.o. male who presents today for a complete physical exam.  He reports consuming a gluten free diet. Gym/ health club routine includes walks. He generally feels well. He reports sleeping well. Discussed the use of AI scribe software for clinical note transcription with the patient, who gave verbal consent to proceed.  He has ongoing issues with his right knee, which has a history of arthritis and a past medial collateral ligament tear. Swelling and discomfort are noted, particularly with weather changes. He has a history of liver issues, which are being monitored by the [place]. A hepatobiliary scan earlier this year showed good results. He has been alcohol-free for six years and feels good about his liver health. He is currently taking Corgard  (nadolol ) for blood pressure management and fish oil supplements.  He has a history of a rotator cuff tear in his shoulder, attributed to a past baseball injury. The shoulder is not currently bothering him, although it has been a source of compensation in the past. He continues on Corgard . He underwent a colonoscopy in 2024, which revealed a tubular adenoma. He is not due for another colonoscopy for several years. He maintains a healthy lifestyle, including regular exercise and a diet that avoids red meat and dairy, focusing on gluten-free and baked foods.  He is managing his weight, which has increased slightly to 204 pounds from 196 pounds. He attributes this to deviations from his regular diet when traveling. He is actively working on maintaining a healthy weight and lifestyle. He has not needed Cialis . 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  He is also interested in traveling, with plans to visit [place] in the future.      Most recent fall risk assessment:    09/04/2024    3:13 PM  Fall Risk   Falls in the past year? 0  Number falls in past yr: 0  Injury with Fall? 0  Risk for fall due to : No Fall Risks  Follow up Falls evaluation completed     Most recent depression screenings:    09/04/2024    3:13 PM 09/04/2023    3:29 PM  PHQ 2/9 Scores  PHQ - 2 Score 0 0    Vision:Dr. Octavia Last year/ upcoming appt Dentist: [facility]    Immunization History  Administered Date(s) Administered   Influenza Split 09/10/2012, 08/07/2013, 08/13/2017   Influenza,inj,Quad PF,6+ Mos 08/11/2022   Influenza-Unspecified 08/14/2014, 08/12/2015, 08/14/2016, 08/13/2018, 08/15/2019, 08/26/2020, 12/18/2020, 08/18/2021, 08/17/2023, 08/15/2024   PFIZER(Purple Top)SARS-COV-2 Vaccination 11/27/2019, 12/18/2019, 09/08/2020   Pfizer(Comirnaty )Fall Seasonal Vaccine 12 years and older 10/06/2022   Tdap 11/30/2006, 02/07/2017, 01/02/2024   Zoster Recombinant(Shingrix) 12/15/2020, 02/16/2021    Health Maintenance  Topic Date Due   HIV Screening  Never done   Pneumococcal Vaccine: 50+ Years (1 of 2 - PCV) Never done   Hepatitis B Vaccines 19-59 Average Risk (1 of 3 - Risk 3-dose series) Never done   COVID-19 Vaccine (5 - 2025-26 season) 09/20/2024 (Originally 07/21/2024)   Colonoscopy  08/27/2028   DTaP/Tdap/Td (4 - Td or Tdap) 01/01/2034   Influenza Vaccine  Completed   Hepatitis C Screening  Completed  Zoster Vaccines- Shingrix  Completed   HPV VACCINES  Aged Out   Meningococcal B Vaccine  Aged Out    Patient Care Team: Joyce Norleen BROCKS, MD as PCP - General (Family Medicine) Pyrtle, Gordy HERO, MD as Consulting Physician (Gastroenterology)   Outpatient Medications Prior to Visit  Medication Sig   Omega-3 Fatty Acids (FISH OIL) 1200 MG CAPS Take 1,200 mg by mouth 2 (two) times daily.   [DISCONTINUED] nadolol  (CORGARD ) 40 MG  tablet Take 1 tablet (40 mg total) by mouth daily.   tadalafil  (CIALIS ) 20 MG tablet Take 1 tablet (20 mg total) by mouth daily as needed for erectile dysfunction. (Patient not taking: Reported on 09/04/2024)   No facility-administered medications prior to visit.    Review of Systems  All other systems reviewed and are negative.   Family and social history as well as health maintenance and immunizations was reviewed.     Objective:       Physical Exam   Alert and in no distress. Tympanic membranes and canals are normal. Pharyngeal area is normal. Neck is supple without adenopathy or thyromegaly. Cardiac exam shows a regular sinus rhythm without murmurs or gallops. Lungs are clear to auscultation.      Assessment & Plan:    Discussed health benefits of physical activity, and encouraged him to engage in regular exercise appropriate for his age and condition.     Erectile dysfunction due to arterial insufficiency  Adult Wellness Visit Routine wellness visit with no acute concerns. Discussed general health, lifestyle, exercise, and diet. - Perform blood work including PSA test.  Essential hypertension Hypertension is well-controlled with Corgard  (nadolol ). - Renew prescription for Corgard  (nadolol ).  Alcoholic cirrhosis of liver and portal hypertension Condition monitored by [facility]. Recent scans indicate stability. Alcohol-free for six years.  Obesity Weight increased to 204 lbs from 196 lbs. Discussed dietary habits and exercise. Managing diet with gluten-free and dairy-free options.  Right knee arthritis and history of medial collateral ligament tear Symptoms include swelling and discomfort, potentially influenced by barometric pressure changes. - Await MRI results to determine further management.  Rotator cuff tear History of rotator cuff tear with no acute issues reported.  Personal history of colon polyps History of tubular adenoma in 2024. Next colonoscopy  scheduled for 2029 or later as per guidelines.  General Health Maintenance Discussed vaccinations, including pneumonia vaccine recommendation for those over 50. Up to date on flu and COVID vaccinations. - Recommend pneumonia vaccination, to be administered at a later date or at the [facility].      He will continue to be followed by the [facility].    Norleen Joyce, MD

## 2024-09-05 ENCOUNTER — Ambulatory Visit: Payer: Self-pay | Admitting: Family Medicine

## 2024-09-05 LAB — LIPID PANEL
Chol/HDL Ratio: 2.8 ratio (ref 0.0–5.0)
Cholesterol, Total: 104 mg/dL (ref 100–199)
HDL: 37 mg/dL — ABNORMAL LOW (ref >39)
LDL Chol Calc (NIH): 51 mg/dL (ref 0–99)
Triglycerides: 76 mg/dL (ref 0–149)
VLDL Cholesterol Cal: 16 mg/dL (ref 5–40)

## 2024-09-05 LAB — COMPREHENSIVE METABOLIC PANEL WITH GFR
ALT: 22 IU/L (ref 0–44)
AST: 24 IU/L (ref 0–40)
Albumin: 4.7 g/dL (ref 3.9–4.9)
Alkaline Phosphatase: 92 IU/L (ref 47–123)
BUN/Creatinine Ratio: 18 (ref 10–24)
BUN: 13 mg/dL (ref 8–27)
Bilirubin Total: 0.7 mg/dL (ref 0.0–1.2)
CO2: 21 mmol/L (ref 20–29)
Calcium: 9 mg/dL (ref 8.6–10.2)
Chloride: 102 mmol/L (ref 96–106)
Creatinine, Ser: 0.74 mg/dL — ABNORMAL LOW (ref 0.76–1.27)
Globulin, Total: 1.8 g/dL (ref 1.5–4.5)
Glucose: 81 mg/dL (ref 70–99)
Potassium: 4.3 mmol/L (ref 3.5–5.2)
Sodium: 138 mmol/L (ref 134–144)
Total Protein: 6.5 g/dL (ref 6.0–8.5)
eGFR: 102 mL/min/1.73 (ref >59)

## 2024-09-05 LAB — CBC WITH DIFFERENTIAL/PLATELET
Basophils Absolute: 0.1 x10E3/uL (ref 0.0–0.2)
Basos: 1 %
EOS (ABSOLUTE): 0.1 x10E3/uL (ref 0.0–0.4)
Eos: 2 %
Hematocrit: 46.2 % (ref 37.5–51.0)
Hemoglobin: 15.6 g/dL (ref 13.0–17.7)
Immature Grans (Abs): 0 x10E3/uL (ref 0.0–0.1)
Immature Granulocytes: 0 %
Lymphocytes Absolute: 2.8 x10E3/uL (ref 0.7–3.1)
Lymphs: 38 %
MCH: 31.8 pg (ref 26.6–33.0)
MCHC: 33.8 g/dL (ref 31.5–35.7)
MCV: 94 fL (ref 79–97)
Monocytes Absolute: 0.7 x10E3/uL (ref 0.1–0.9)
Monocytes: 9 %
Neutrophils Absolute: 3.7 x10E3/uL (ref 1.4–7.0)
Neutrophils: 50 %
Platelets: 170 x10E3/uL (ref 150–450)
RBC: 4.9 x10E6/uL (ref 4.14–5.80)
RDW: 12.1 % (ref 11.6–15.4)
WBC: 7.3 x10E3/uL (ref 3.4–10.8)

## 2024-09-05 LAB — PSA: Prostate Specific Ag, Serum: 0.6 ng/mL (ref 0.0–4.0)

## 2024-09-08 ENCOUNTER — Other Ambulatory Visit (HOSPITAL_COMMUNITY): Payer: Self-pay

## 2024-09-16 ENCOUNTER — Other Ambulatory Visit (HOSPITAL_COMMUNITY): Payer: Self-pay

## 2024-09-18 ENCOUNTER — Encounter: Payer: Self-pay | Admitting: Family Medicine

## 2024-09-23 ENCOUNTER — Encounter: Payer: Self-pay | Admitting: Family Medicine

## 2024-10-27 DIAGNOSIS — H2513 Age-related nuclear cataract, bilateral: Secondary | ICD-10-CM | POA: Diagnosis not present

## 2024-10-27 DIAGNOSIS — H10413 Chronic giant papillary conjunctivitis, bilateral: Secondary | ICD-10-CM | POA: Diagnosis not present

## 2024-10-27 DIAGNOSIS — H33311 Horseshoe tear of retina without detachment, right eye: Secondary | ICD-10-CM | POA: Diagnosis not present

## 2024-10-27 DIAGNOSIS — H43811 Vitreous degeneration, right eye: Secondary | ICD-10-CM | POA: Diagnosis not present

## 2024-10-28 ENCOUNTER — Encounter (INDEPENDENT_AMBULATORY_CARE_PROVIDER_SITE_OTHER): Payer: Self-pay | Admitting: Ophthalmology

## 2024-10-28 ENCOUNTER — Encounter (INDEPENDENT_AMBULATORY_CARE_PROVIDER_SITE_OTHER): Admitting: Ophthalmology

## 2024-10-28 ENCOUNTER — Other Ambulatory Visit (HOSPITAL_COMMUNITY): Payer: Self-pay

## 2024-10-28 ENCOUNTER — Ambulatory Visit (INDEPENDENT_AMBULATORY_CARE_PROVIDER_SITE_OTHER): Admitting: Ophthalmology

## 2024-10-28 DIAGNOSIS — I10 Essential (primary) hypertension: Secondary | ICD-10-CM

## 2024-10-28 DIAGNOSIS — H25813 Combined forms of age-related cataract, bilateral: Secondary | ICD-10-CM | POA: Diagnosis not present

## 2024-10-28 DIAGNOSIS — H33321 Round hole, right eye: Secondary | ICD-10-CM

## 2024-10-28 DIAGNOSIS — H43811 Vitreous degeneration, right eye: Secondary | ICD-10-CM | POA: Diagnosis not present

## 2024-10-28 DIAGNOSIS — H35033 Hypertensive retinopathy, bilateral: Secondary | ICD-10-CM | POA: Diagnosis not present

## 2024-10-28 DIAGNOSIS — H33311 Horseshoe tear of retina without detachment, right eye: Secondary | ICD-10-CM | POA: Diagnosis not present

## 2024-10-28 MED ORDER — PREDNISOLONE ACETATE 1 % OP SUSP
1.0000 [drp] | Freq: Four times a day (QID) | OPHTHALMIC | 0 refills | Status: AC
  Filled 2024-10-28: qty 5, 25d supply, fill #0
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
## 2024-10-29 ENCOUNTER — Encounter (INDEPENDENT_AMBULATORY_CARE_PROVIDER_SITE_OTHER): Admitting: Ophthalmology

## 2024-10-30 ENCOUNTER — Other Ambulatory Visit (HOSPITAL_COMMUNITY): Payer: Self-pay

## 2024-10-30 ENCOUNTER — Encounter: Payer: Self-pay | Admitting: Family Medicine
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
## 2024-11-30 ENCOUNTER — Encounter: Payer: Self-pay | Admitting: Family Medicine

## 2024-12-01 ENCOUNTER — Encounter (INDEPENDENT_AMBULATORY_CARE_PROVIDER_SITE_OTHER): Payer: Self-pay | Admitting: Ophthalmology

## 2024-12-01 ENCOUNTER — Ambulatory Visit (INDEPENDENT_AMBULATORY_CARE_PROVIDER_SITE_OTHER): Admitting: Ophthalmology

## 2024-12-01 ENCOUNTER — Other Ambulatory Visit: Payer: Self-pay

## 2024-12-01 DIAGNOSIS — H25813 Combined forms of age-related cataract, bilateral: Secondary | ICD-10-CM

## 2024-12-01 DIAGNOSIS — H33311 Horseshoe tear of retina without detachment, right eye: Secondary | ICD-10-CM | POA: Diagnosis not present

## 2024-12-01 DIAGNOSIS — H35033 Hypertensive retinopathy, bilateral: Secondary | ICD-10-CM

## 2024-12-01 DIAGNOSIS — H43811 Vitreous degeneration, right eye: Secondary | ICD-10-CM

## 2024-12-01 DIAGNOSIS — I10 Essential (primary) hypertension: Secondary | ICD-10-CM | POA: Diagnosis not present

## 2024-12-01 DIAGNOSIS — H33321 Round hole, right eye: Secondary | ICD-10-CM

## 2024-12-04 ENCOUNTER — Encounter (INDEPENDENT_AMBULATORY_CARE_PROVIDER_SITE_OTHER): Payer: Self-pay | Admitting: Ophthalmology

## 2024-12-07 ENCOUNTER — Encounter (INDEPENDENT_AMBULATORY_CARE_PROVIDER_SITE_OTHER): Payer: Self-pay | Admitting: Ophthalmology
